# Patient Record
Sex: Female | Born: 1945
Health system: Southern US, Community
[De-identification: ages and names within clinical notes are randomized; demographics above are authoritative.]

---

## 2014-06-19 DIAGNOSIS — R6889 Other general symptoms and signs: Secondary | ICD-10-CM | POA: Diagnosis not present

## 2014-06-25 DIAGNOSIS — Z23 Encounter for immunization: Secondary | ICD-10-CM | POA: Diagnosis not present

## 2014-06-25 DIAGNOSIS — R0789 Other chest pain: Secondary | ICD-10-CM | POA: Diagnosis not present

## 2014-06-30 DIAGNOSIS — L720 Epidermal cyst: Secondary | ICD-10-CM | POA: Diagnosis not present

## 2014-06-30 DIAGNOSIS — L900 Lichen sclerosus et atrophicus: Secondary | ICD-10-CM | POA: Diagnosis not present

## 2014-06-30 DIAGNOSIS — Z09 Encounter for follow-up examination after completed treatment for conditions other than malignant neoplasm: Secondary | ICD-10-CM | POA: Diagnosis not present

## 2014-06-30 DIAGNOSIS — L239 Allergic contact dermatitis, unspecified cause: Secondary | ICD-10-CM | POA: Diagnosis not present

## 2014-06-30 DIAGNOSIS — Q828 Other specified congenital malformations of skin: Secondary | ICD-10-CM | POA: Diagnosis not present

## 2014-06-30 DIAGNOSIS — Z808 Family history of malignant neoplasm of other organs or systems: Secondary | ICD-10-CM | POA: Diagnosis not present

## 2014-06-30 DIAGNOSIS — D2272 Melanocytic nevi of left lower limb, including hip: Secondary | ICD-10-CM | POA: Diagnosis not present

## 2014-06-30 DIAGNOSIS — L820 Inflamed seborrheic keratosis: Secondary | ICD-10-CM | POA: Diagnosis not present

## 2014-06-30 DIAGNOSIS — L309 Dermatitis, unspecified: Secondary | ICD-10-CM | POA: Diagnosis not present

## 2014-06-30 DIAGNOSIS — L821 Other seborrheic keratosis: Secondary | ICD-10-CM | POA: Diagnosis not present

## 2014-07-04 DIAGNOSIS — R079 Chest pain, unspecified: Secondary | ICD-10-CM | POA: Diagnosis not present

## 2014-07-04 DIAGNOSIS — I348 Other nonrheumatic mitral valve disorders: Secondary | ICD-10-CM | POA: Diagnosis not present

## 2014-07-07 DIAGNOSIS — L900 Lichen sclerosus et atrophicus: Secondary | ICD-10-CM | POA: Diagnosis not present

## 2014-08-07 DIAGNOSIS — E782 Mixed hyperlipidemia: Secondary | ICD-10-CM | POA: Diagnosis not present

## 2014-08-07 DIAGNOSIS — M8589 Other specified disorders of bone density and structure, multiple sites: Secondary | ICD-10-CM | POA: Diagnosis not present

## 2014-08-07 DIAGNOSIS — F411 Generalized anxiety disorder: Secondary | ICD-10-CM | POA: Diagnosis not present

## 2014-08-07 DIAGNOSIS — Z0001 Encounter for general adult medical examination with abnormal findings: Secondary | ICD-10-CM | POA: Diagnosis not present

## 2014-08-07 DIAGNOSIS — Z79899 Other long term (current) drug therapy: Secondary | ICD-10-CM | POA: Diagnosis not present

## 2014-08-07 DIAGNOSIS — G4700 Insomnia, unspecified: Secondary | ICD-10-CM | POA: Diagnosis not present

## 2014-08-19 DIAGNOSIS — R29890 Loss of height: Secondary | ICD-10-CM | POA: Diagnosis not present

## 2014-08-19 DIAGNOSIS — M419 Scoliosis, unspecified: Secondary | ICD-10-CM | POA: Diagnosis not present

## 2014-08-19 DIAGNOSIS — M8589 Other specified disorders of bone density and structure, multiple sites: Secondary | ICD-10-CM | POA: Diagnosis not present

## 2014-08-19 DIAGNOSIS — E28319 Asymptomatic premature menopause: Secondary | ICD-10-CM | POA: Diagnosis not present

## 2014-08-19 DIAGNOSIS — M549 Dorsalgia, unspecified: Secondary | ICD-10-CM | POA: Diagnosis not present

## 2014-09-29 DIAGNOSIS — L259 Unspecified contact dermatitis, unspecified cause: Secondary | ICD-10-CM | POA: Diagnosis not present

## 2014-09-29 DIAGNOSIS — L900 Lichen sclerosus et atrophicus: Secondary | ICD-10-CM | POA: Diagnosis not present

## 2014-11-06 DIAGNOSIS — L900 Lichen sclerosus et atrophicus: Secondary | ICD-10-CM | POA: Diagnosis not present

## 2014-11-28 DIAGNOSIS — L989 Disorder of the skin and subcutaneous tissue, unspecified: Secondary | ICD-10-CM | POA: Diagnosis not present

## 2014-11-28 DIAGNOSIS — L900 Lichen sclerosus et atrophicus: Secondary | ICD-10-CM | POA: Diagnosis not present

## 2014-12-01 DIAGNOSIS — D485 Neoplasm of uncertain behavior of skin: Secondary | ICD-10-CM | POA: Diagnosis not present

## 2014-12-01 DIAGNOSIS — L570 Actinic keratosis: Secondary | ICD-10-CM | POA: Diagnosis not present

## 2014-12-01 DIAGNOSIS — L280 Lichen simplex chronicus: Secondary | ICD-10-CM | POA: Diagnosis not present

## 2015-03-06 DIAGNOSIS — E559 Vitamin D deficiency, unspecified: Secondary | ICD-10-CM | POA: Diagnosis not present

## 2015-03-06 DIAGNOSIS — Z79899 Other long term (current) drug therapy: Secondary | ICD-10-CM | POA: Diagnosis not present

## 2015-03-06 DIAGNOSIS — R0602 Shortness of breath: Secondary | ICD-10-CM | POA: Diagnosis not present

## 2015-03-06 DIAGNOSIS — Z23 Encounter for immunization: Secondary | ICD-10-CM | POA: Diagnosis not present

## 2015-03-06 DIAGNOSIS — R5383 Other fatigue: Secondary | ICD-10-CM | POA: Diagnosis not present

## 2015-03-06 DIAGNOSIS — E7800 Pure hypercholesterolemia, unspecified: Secondary | ICD-10-CM | POA: Diagnosis not present

## 2015-03-06 DIAGNOSIS — Z0000 Encounter for general adult medical examination without abnormal findings: Secondary | ICD-10-CM | POA: Diagnosis not present

## 2015-03-06 DIAGNOSIS — Z1389 Encounter for screening for other disorder: Secondary | ICD-10-CM | POA: Diagnosis not present

## 2015-03-16 DIAGNOSIS — E7800 Pure hypercholesterolemia, unspecified: Secondary | ICD-10-CM | POA: Diagnosis not present

## 2015-03-16 DIAGNOSIS — J309 Allergic rhinitis, unspecified: Secondary | ICD-10-CM | POA: Diagnosis not present

## 2015-03-16 DIAGNOSIS — E559 Vitamin D deficiency, unspecified: Secondary | ICD-10-CM | POA: Diagnosis not present

## 2015-03-16 DIAGNOSIS — H1013 Acute atopic conjunctivitis, bilateral: Secondary | ICD-10-CM | POA: Diagnosis not present

## 2015-04-02 DIAGNOSIS — L900 Lichen sclerosus et atrophicus: Secondary | ICD-10-CM | POA: Diagnosis not present

## 2015-05-01 DIAGNOSIS — B353 Tinea pedis: Secondary | ICD-10-CM | POA: Diagnosis not present

## 2015-05-01 DIAGNOSIS — L900 Lichen sclerosus et atrophicus: Secondary | ICD-10-CM | POA: Diagnosis not present

## 2015-06-23 DIAGNOSIS — R05 Cough: Secondary | ICD-10-CM | POA: Diagnosis not present

## 2015-06-23 DIAGNOSIS — J018 Other acute sinusitis: Secondary | ICD-10-CM | POA: Diagnosis not present

## 2015-07-17 DIAGNOSIS — N904 Leukoplakia of vulva: Secondary | ICD-10-CM | POA: Diagnosis not present

## 2015-07-17 DIAGNOSIS — Z79899 Other long term (current) drug therapy: Secondary | ICD-10-CM | POA: Diagnosis not present

## 2015-10-29 DIAGNOSIS — I10 Essential (primary) hypertension: Secondary | ICD-10-CM | POA: Diagnosis not present

## 2015-10-29 DIAGNOSIS — G4700 Insomnia, unspecified: Secondary | ICD-10-CM | POA: Diagnosis not present

## 2015-10-29 DIAGNOSIS — M810 Age-related osteoporosis without current pathological fracture: Secondary | ICD-10-CM | POA: Diagnosis not present

## 2015-10-29 DIAGNOSIS — E7800 Pure hypercholesterolemia, unspecified: Secondary | ICD-10-CM | POA: Diagnosis not present

## 2015-10-29 DIAGNOSIS — Z79899 Other long term (current) drug therapy: Secondary | ICD-10-CM | POA: Diagnosis not present

## 2015-10-29 DIAGNOSIS — F329 Major depressive disorder, single episode, unspecified: Secondary | ICD-10-CM | POA: Diagnosis not present

## 2015-10-30 DIAGNOSIS — E559 Vitamin D deficiency, unspecified: Secondary | ICD-10-CM | POA: Diagnosis not present

## 2015-10-30 DIAGNOSIS — Z79899 Other long term (current) drug therapy: Secondary | ICD-10-CM | POA: Diagnosis not present

## 2015-10-30 DIAGNOSIS — M810 Age-related osteoporosis without current pathological fracture: Secondary | ICD-10-CM | POA: Diagnosis not present

## 2015-10-30 DIAGNOSIS — E7800 Pure hypercholesterolemia, unspecified: Secondary | ICD-10-CM | POA: Diagnosis not present

## 2015-11-17 ENCOUNTER — Other Ambulatory Visit: Payer: Self-pay | Admitting: Family Medicine

## 2015-11-17 DIAGNOSIS — Z1231 Encounter for screening mammogram for malignant neoplasm of breast: Secondary | ICD-10-CM

## 2015-11-25 ENCOUNTER — Ambulatory Visit
Admission: RE | Admit: 2015-11-25 | Discharge: 2015-11-25 | Disposition: A | Discharge status: Home / Self Care | Payer: Medicare Other | Source: Ambulatory Visit | Attending: Family Medicine | Admitting: Family Medicine

## 2015-11-25 DIAGNOSIS — Z1231 Encounter for screening mammogram for malignant neoplasm of breast: Secondary | ICD-10-CM | POA: Diagnosis not present

## 2016-01-15 DIAGNOSIS — L309 Dermatitis, unspecified: Secondary | ICD-10-CM | POA: Diagnosis not present

## 2016-01-15 DIAGNOSIS — L851 Acquired keratosis [keratoderma] palmaris et plantaris: Secondary | ICD-10-CM | POA: Diagnosis not present

## 2016-01-15 DIAGNOSIS — L900 Lichen sclerosus et atrophicus: Secondary | ICD-10-CM | POA: Diagnosis not present

## 2016-03-11 DIAGNOSIS — Z23 Encounter for immunization: Secondary | ICD-10-CM | POA: Diagnosis not present

## 2016-04-25 DIAGNOSIS — J069 Acute upper respiratory infection, unspecified: Secondary | ICD-10-CM | POA: Diagnosis not present

## 2016-06-23 DIAGNOSIS — Z0000 Encounter for general adult medical examination without abnormal findings: Secondary | ICD-10-CM | POA: Diagnosis not present

## 2016-06-23 DIAGNOSIS — E559 Vitamin D deficiency, unspecified: Secondary | ICD-10-CM | POA: Diagnosis not present

## 2016-06-23 DIAGNOSIS — R7301 Impaired fasting glucose: Secondary | ICD-10-CM | POA: Diagnosis not present

## 2016-06-23 DIAGNOSIS — E7800 Pure hypercholesterolemia, unspecified: Secondary | ICD-10-CM | POA: Diagnosis not present

## 2016-06-23 DIAGNOSIS — I10 Essential (primary) hypertension: Secondary | ICD-10-CM | POA: Diagnosis not present

## 2016-06-23 DIAGNOSIS — G4700 Insomnia, unspecified: Secondary | ICD-10-CM | POA: Diagnosis not present

## 2016-06-23 DIAGNOSIS — Z6829 Body mass index (BMI) 29.0-29.9, adult: Secondary | ICD-10-CM | POA: Diagnosis not present

## 2016-06-23 DIAGNOSIS — Z79899 Other long term (current) drug therapy: Secondary | ICD-10-CM | POA: Diagnosis not present

## 2016-06-23 DIAGNOSIS — Z1159 Encounter for screening for other viral diseases: Secondary | ICD-10-CM | POA: Diagnosis not present

## 2016-07-12 DIAGNOSIS — Z1211 Encounter for screening for malignant neoplasm of colon: Secondary | ICD-10-CM | POA: Diagnosis not present

## 2016-07-15 DIAGNOSIS — L851 Acquired keratosis [keratoderma] palmaris et plantaris: Secondary | ICD-10-CM | POA: Diagnosis not present

## 2016-07-15 DIAGNOSIS — L821 Other seborrheic keratosis: Secondary | ICD-10-CM | POA: Diagnosis not present

## 2016-07-15 DIAGNOSIS — L720 Epidermal cyst: Secondary | ICD-10-CM | POA: Diagnosis not present

## 2016-07-15 DIAGNOSIS — L900 Lichen sclerosus et atrophicus: Secondary | ICD-10-CM | POA: Diagnosis not present

## 2016-07-15 DIAGNOSIS — L918 Other hypertrophic disorders of the skin: Secondary | ICD-10-CM | POA: Diagnosis not present

## 2016-07-15 DIAGNOSIS — Q828 Other specified congenital malformations of skin: Secondary | ICD-10-CM | POA: Diagnosis not present

## 2016-07-15 DIAGNOSIS — L820 Inflamed seborrheic keratosis: Secondary | ICD-10-CM | POA: Diagnosis not present

## 2016-08-19 DIAGNOSIS — L720 Epidermal cyst: Secondary | ICD-10-CM | POA: Diagnosis not present

## 2016-08-19 DIAGNOSIS — L989 Disorder of the skin and subcutaneous tissue, unspecified: Secondary | ICD-10-CM | POA: Diagnosis not present

## 2016-08-19 DIAGNOSIS — D485 Neoplasm of uncertain behavior of skin: Secondary | ICD-10-CM | POA: Diagnosis not present

## 2016-08-19 DIAGNOSIS — Z888 Allergy status to other drugs, medicaments and biological substances status: Secondary | ICD-10-CM | POA: Diagnosis not present

## 2016-08-19 DIAGNOSIS — C44519 Basal cell carcinoma of skin of other part of trunk: Secondary | ICD-10-CM | POA: Diagnosis not present

## 2016-10-07 DIAGNOSIS — C4491 Basal cell carcinoma of skin, unspecified: Secondary | ICD-10-CM | POA: Diagnosis not present

## 2016-10-07 DIAGNOSIS — C44519 Basal cell carcinoma of skin of other part of trunk: Secondary | ICD-10-CM | POA: Diagnosis not present

## 2016-11-14 ENCOUNTER — Other Ambulatory Visit: Payer: Self-pay | Admitting: Family Medicine

## 2016-11-14 DIAGNOSIS — Z1231 Encounter for screening mammogram for malignant neoplasm of breast: Secondary | ICD-10-CM

## 2016-11-24 DIAGNOSIS — R2230 Localized swelling, mass and lump, unspecified upper limb: Secondary | ICD-10-CM | POA: Diagnosis not present

## 2016-11-24 DIAGNOSIS — Z800 Family history of malignant neoplasm of digestive organs: Secondary | ICD-10-CM | POA: Diagnosis not present

## 2017-01-02 ENCOUNTER — Other Ambulatory Visit: Payer: Self-pay | Admitting: Family Medicine

## 2017-01-02 DIAGNOSIS — R2232 Localized swelling, mass and lump, left upper limb: Secondary | ICD-10-CM

## 2017-01-04 DIAGNOSIS — Z800 Family history of malignant neoplasm of digestive organs: Secondary | ICD-10-CM | POA: Diagnosis not present

## 2017-01-04 DIAGNOSIS — K6289 Other specified diseases of anus and rectum: Secondary | ICD-10-CM | POA: Diagnosis not present

## 2017-01-04 DIAGNOSIS — D126 Benign neoplasm of colon, unspecified: Secondary | ICD-10-CM | POA: Diagnosis not present

## 2017-01-04 DIAGNOSIS — Z1211 Encounter for screening for malignant neoplasm of colon: Secondary | ICD-10-CM | POA: Diagnosis not present

## 2017-01-04 DIAGNOSIS — D175 Benign lipomatous neoplasm of intra-abdominal organs: Secondary | ICD-10-CM | POA: Diagnosis not present

## 2017-01-05 DIAGNOSIS — D126 Benign neoplasm of colon, unspecified: Secondary | ICD-10-CM | POA: Diagnosis not present

## 2017-01-05 DIAGNOSIS — Z1211 Encounter for screening for malignant neoplasm of colon: Secondary | ICD-10-CM | POA: Diagnosis not present

## 2017-01-30 ENCOUNTER — Other Ambulatory Visit: Payer: Medicare Other

## 2017-02-03 ENCOUNTER — Other Ambulatory Visit: Payer: Medicare Other

## 2017-02-07 ENCOUNTER — Ambulatory Visit
Admission: RE | Admit: 2017-02-07 | Discharge: 2017-02-07 | Disposition: A | Discharge status: Home / Self Care | Payer: Medicare Other | Source: Ambulatory Visit | Attending: Family Medicine | Admitting: Family Medicine

## 2017-02-07 DIAGNOSIS — R928 Other abnormal and inconclusive findings on diagnostic imaging of breast: Secondary | ICD-10-CM | POA: Diagnosis not present

## 2017-02-07 DIAGNOSIS — R2232 Localized swelling, mass and lump, left upper limb: Secondary | ICD-10-CM

## 2017-02-07 DIAGNOSIS — N6489 Other specified disorders of breast: Secondary | ICD-10-CM | POA: Diagnosis not present

## 2017-02-08 DIAGNOSIS — M8588 Other specified disorders of bone density and structure, other site: Secondary | ICD-10-CM | POA: Diagnosis not present

## 2017-02-08 DIAGNOSIS — E2839 Other primary ovarian failure: Secondary | ICD-10-CM | POA: Diagnosis not present

## 2017-02-10 DIAGNOSIS — L900 Lichen sclerosus et atrophicus: Secondary | ICD-10-CM | POA: Diagnosis not present

## 2017-03-09 DIAGNOSIS — Z23 Encounter for immunization: Secondary | ICD-10-CM | POA: Diagnosis not present

## 2017-03-21 DIAGNOSIS — J0100 Acute maxillary sinusitis, unspecified: Secondary | ICD-10-CM | POA: Diagnosis not present

## 2017-03-21 DIAGNOSIS — J069 Acute upper respiratory infection, unspecified: Secondary | ICD-10-CM | POA: Diagnosis not present

## 2017-03-28 ENCOUNTER — Other Ambulatory Visit: Payer: Self-pay | Admitting: Family Medicine

## 2017-03-28 ENCOUNTER — Ambulatory Visit
Admission: RE | Admit: 2017-03-28 | Discharge: 2017-03-28 | Disposition: A | Discharge status: Home / Self Care | Payer: Medicare Other | Source: Ambulatory Visit | Attending: Family Medicine | Admitting: Family Medicine

## 2017-03-28 DIAGNOSIS — R059 Cough, unspecified: Secondary | ICD-10-CM

## 2017-03-28 DIAGNOSIS — R062 Wheezing: Secondary | ICD-10-CM

## 2017-03-28 DIAGNOSIS — R05 Cough: Secondary | ICD-10-CM

## 2017-03-28 DIAGNOSIS — J9801 Acute bronchospasm: Secondary | ICD-10-CM | POA: Diagnosis not present

## 2017-07-04 DIAGNOSIS — E559 Vitamin D deficiency, unspecified: Secondary | ICD-10-CM | POA: Diagnosis not present

## 2017-07-04 DIAGNOSIS — E7800 Pure hypercholesterolemia, unspecified: Secondary | ICD-10-CM | POA: Diagnosis not present

## 2017-07-04 DIAGNOSIS — R7301 Impaired fasting glucose: Secondary | ICD-10-CM | POA: Diagnosis not present

## 2017-07-04 DIAGNOSIS — Z79899 Other long term (current) drug therapy: Secondary | ICD-10-CM | POA: Diagnosis not present

## 2017-07-11 DIAGNOSIS — E559 Vitamin D deficiency, unspecified: Secondary | ICD-10-CM | POA: Diagnosis not present

## 2017-07-11 DIAGNOSIS — F322 Major depressive disorder, single episode, severe without psychotic features: Secondary | ICD-10-CM | POA: Diagnosis not present

## 2017-07-11 DIAGNOSIS — G4700 Insomnia, unspecified: Secondary | ICD-10-CM | POA: Diagnosis not present

## 2017-07-11 DIAGNOSIS — R7301 Impaired fasting glucose: Secondary | ICD-10-CM | POA: Diagnosis not present

## 2017-07-11 DIAGNOSIS — Z0000 Encounter for general adult medical examination without abnormal findings: Secondary | ICD-10-CM | POA: Diagnosis not present

## 2017-07-11 DIAGNOSIS — Z6829 Body mass index (BMI) 29.0-29.9, adult: Secondary | ICD-10-CM | POA: Diagnosis not present

## 2017-07-11 DIAGNOSIS — E7800 Pure hypercholesterolemia, unspecified: Secondary | ICD-10-CM | POA: Diagnosis not present

## 2017-08-11 DIAGNOSIS — L900 Lichen sclerosus et atrophicus: Secondary | ICD-10-CM | POA: Diagnosis not present

## 2017-08-11 DIAGNOSIS — L309 Dermatitis, unspecified: Secondary | ICD-10-CM | POA: Diagnosis not present

## 2017-08-11 DIAGNOSIS — L249 Irritant contact dermatitis, unspecified cause: Secondary | ICD-10-CM | POA: Diagnosis not present

## 2017-08-11 DIAGNOSIS — Z888 Allergy status to other drugs, medicaments and biological substances status: Secondary | ICD-10-CM | POA: Diagnosis not present

## 2017-08-17 DIAGNOSIS — L304 Erythema intertrigo: Secondary | ICD-10-CM | POA: Diagnosis not present

## 2017-08-18 DIAGNOSIS — L304 Erythema intertrigo: Secondary | ICD-10-CM | POA: Diagnosis not present

## 2017-08-24 DIAGNOSIS — K6289 Other specified diseases of anus and rectum: Secondary | ICD-10-CM | POA: Diagnosis not present

## 2017-08-24 DIAGNOSIS — D126 Benign neoplasm of colon, unspecified: Secondary | ICD-10-CM | POA: Diagnosis not present

## 2017-08-24 DIAGNOSIS — Z86010 Personal history of colonic polyps: Secondary | ICD-10-CM | POA: Diagnosis not present

## 2017-08-24 DIAGNOSIS — K635 Polyp of colon: Secondary | ICD-10-CM | POA: Diagnosis not present

## 2017-08-29 DIAGNOSIS — D126 Benign neoplasm of colon, unspecified: Secondary | ICD-10-CM | POA: Diagnosis not present

## 2017-08-29 DIAGNOSIS — K635 Polyp of colon: Secondary | ICD-10-CM | POA: Diagnosis not present

## 2017-10-09 DIAGNOSIS — Z85828 Personal history of other malignant neoplasm of skin: Secondary | ICD-10-CM | POA: Diagnosis not present

## 2017-10-09 DIAGNOSIS — L720 Epidermal cyst: Secondary | ICD-10-CM | POA: Diagnosis not present

## 2017-10-09 DIAGNOSIS — L918 Other hypertrophic disorders of the skin: Secondary | ICD-10-CM | POA: Diagnosis not present

## 2017-10-09 DIAGNOSIS — D1801 Hemangioma of skin and subcutaneous tissue: Secondary | ICD-10-CM | POA: Diagnosis not present

## 2017-10-09 DIAGNOSIS — D229 Melanocytic nevi, unspecified: Secondary | ICD-10-CM | POA: Diagnosis not present

## 2017-10-09 DIAGNOSIS — L821 Other seborrheic keratosis: Secondary | ICD-10-CM | POA: Diagnosis not present

## 2017-10-09 DIAGNOSIS — I781 Nevus, non-neoplastic: Secondary | ICD-10-CM | POA: Diagnosis not present

## 2017-10-21 IMAGING — MG DIGITAL SCREENING BILATERAL MAMMOGRAM WITH CAD
4 series · 4 of 4 positions shown · non-contrast
Comparison: Previous exam(s).

CLINICAL DATA: Screening.

EXAM:
DIGITAL SCREENING BILATERAL MAMMOGRAM WITH CAD

[L MLO]
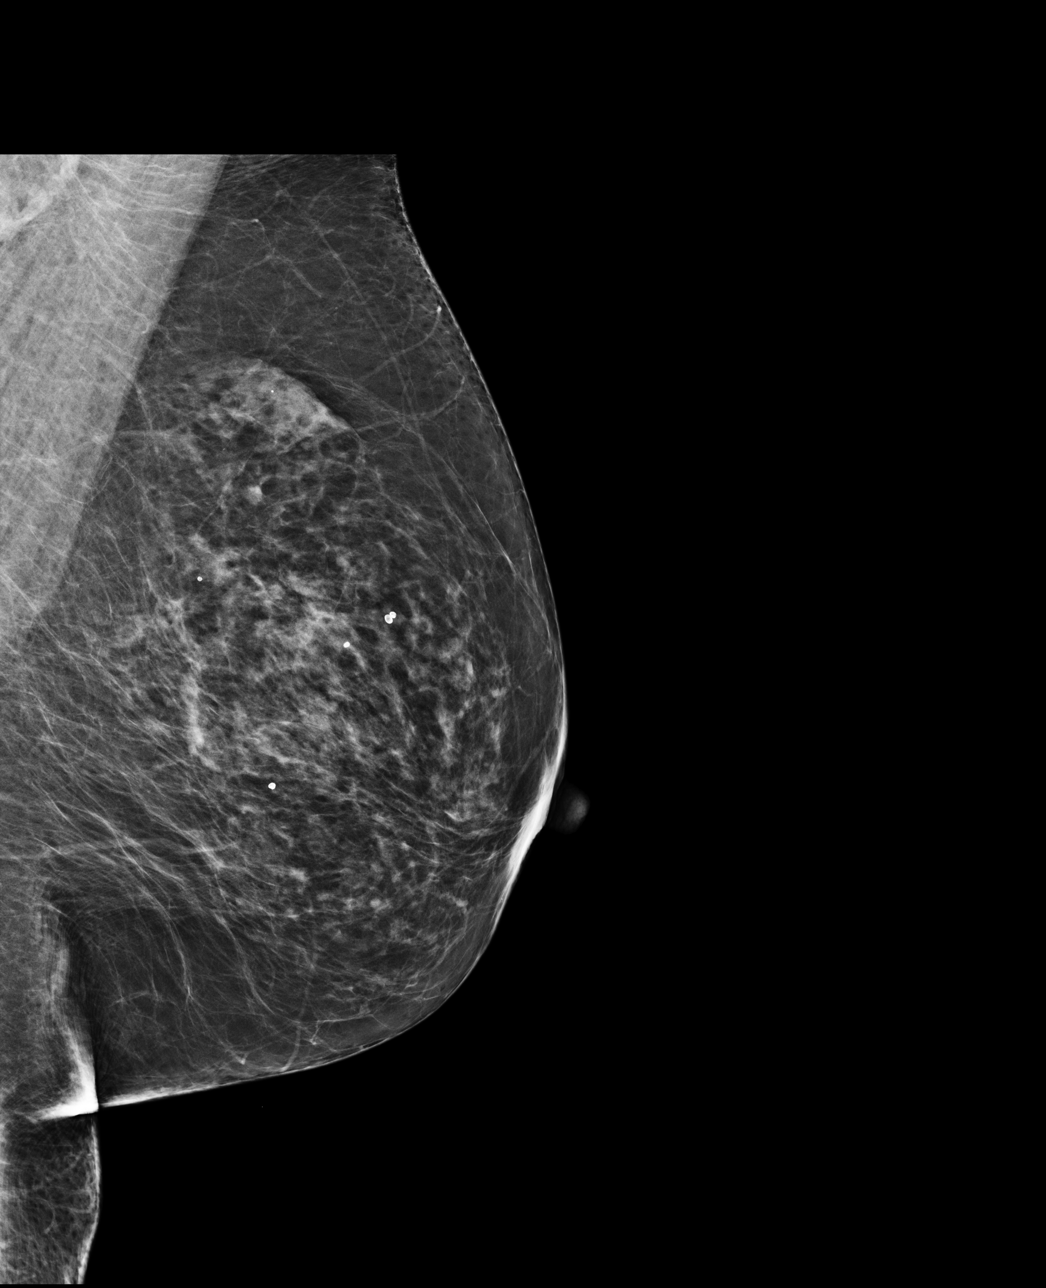

[L CC]
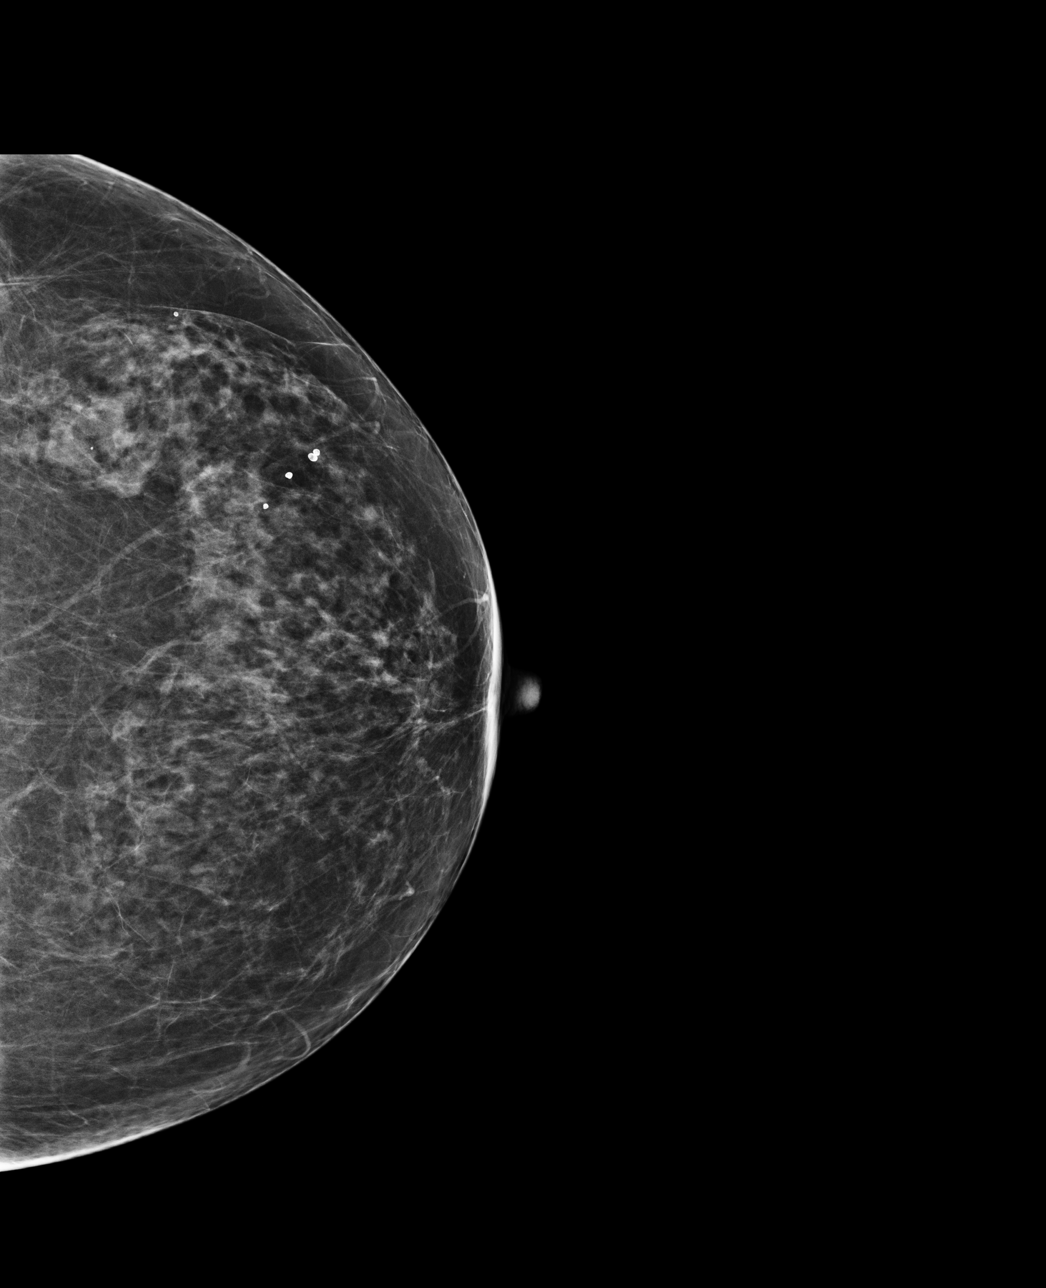

[R CC]
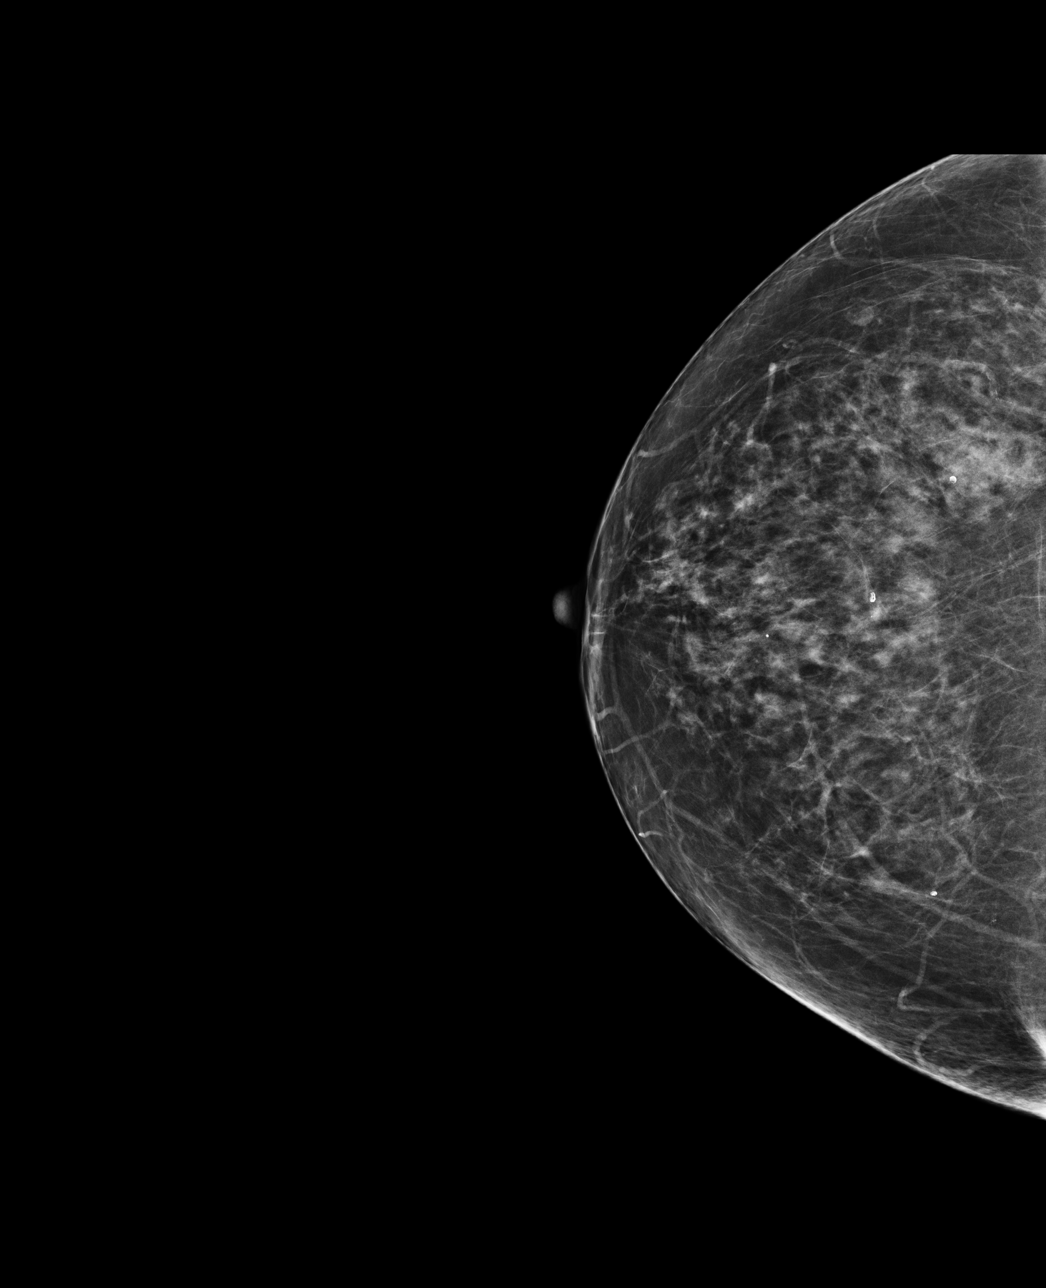

[R MLO]
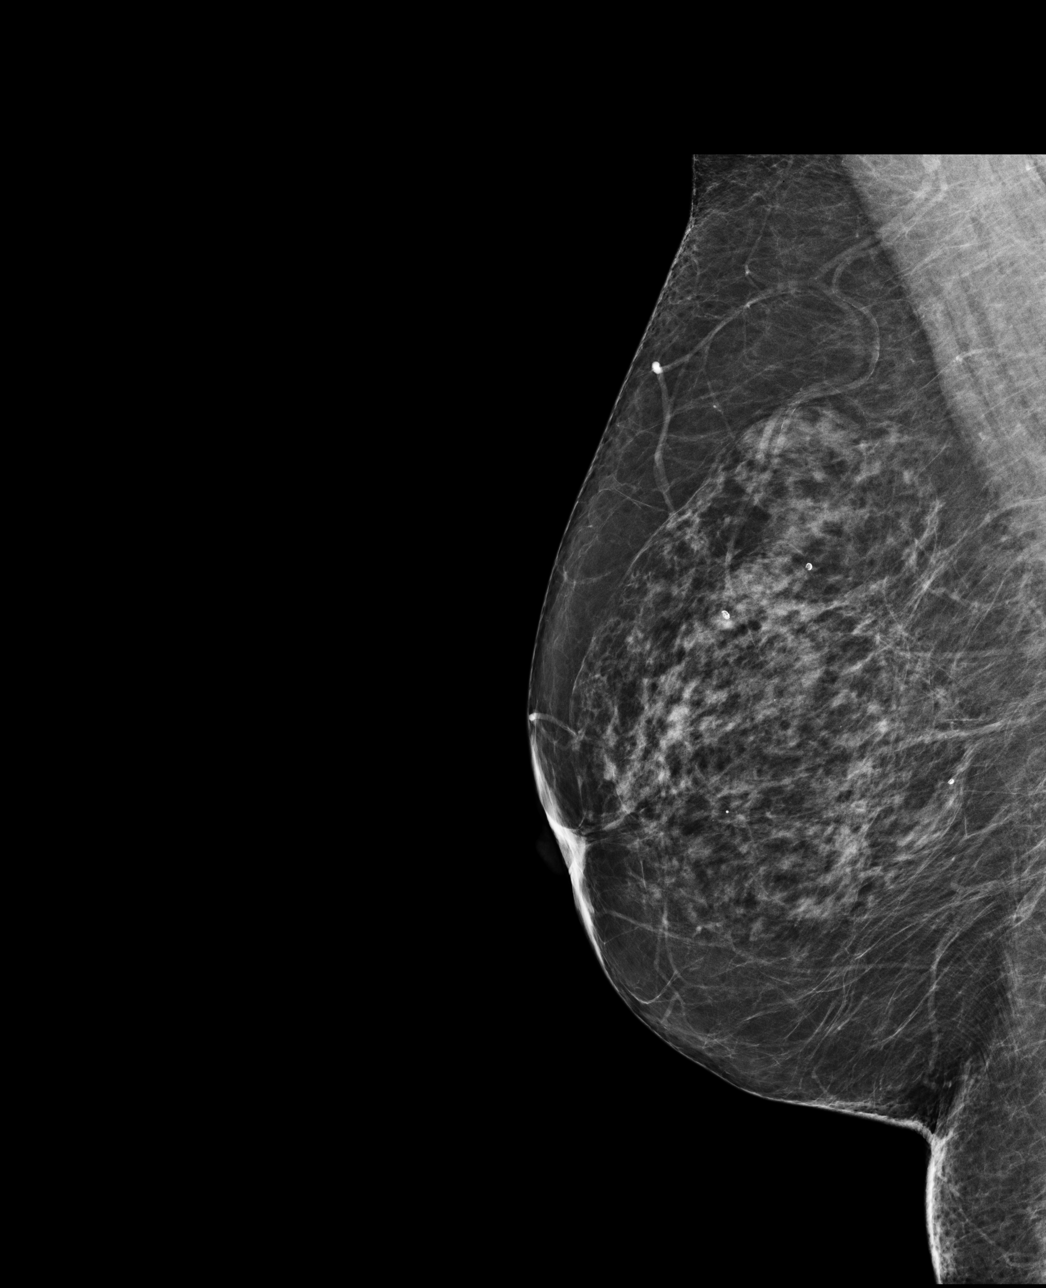

[4 of 4 positions shown; findings below may reference images not displayed]

ACR Breast Density Category c: The breast tissue is heterogeneously
dense, which may obscure small masses.
FINDINGS: There are no findings suspicious for malignancy. Images were
processed with CAD.
IMPRESSION: No mammographic evidence of malignancy. A result letter of this
screening mammogram will be mailed directly to the patient.

RECOMMENDATION:
Screening mammogram in one year. (Code:[id number])

BI-RADS CATEGORY  1: Negative.

## 2017-10-25 DIAGNOSIS — R05 Cough: Secondary | ICD-10-CM | POA: Diagnosis not present

## 2017-11-17 DIAGNOSIS — L900 Lichen sclerosus et atrophicus: Secondary | ICD-10-CM | POA: Diagnosis not present

## 2017-11-17 DIAGNOSIS — L304 Erythema intertrigo: Secondary | ICD-10-CM | POA: Diagnosis not present

## 2018-03-05 DIAGNOSIS — Z23 Encounter for immunization: Secondary | ICD-10-CM | POA: Diagnosis not present

## 2018-03-06 DIAGNOSIS — R05 Cough: Secondary | ICD-10-CM | POA: Diagnosis not present

## 2018-03-15 ENCOUNTER — Other Ambulatory Visit: Payer: Self-pay | Admitting: Family Medicine

## 2018-03-15 DIAGNOSIS — Z1231 Encounter for screening mammogram for malignant neoplasm of breast: Secondary | ICD-10-CM

## 2018-03-20 DIAGNOSIS — B370 Candidal stomatitis: Secondary | ICD-10-CM | POA: Diagnosis not present

## 2018-03-27 ENCOUNTER — Ambulatory Visit
Admission: RE | Admit: 2018-03-27 | Discharge: 2018-03-27 | Disposition: A | Discharge status: Home / Self Care | Payer: Medicare Other | Source: Ambulatory Visit | Attending: Family Medicine | Admitting: Family Medicine

## 2018-03-27 DIAGNOSIS — Z1231 Encounter for screening mammogram for malignant neoplasm of breast: Secondary | ICD-10-CM

## 2018-06-01 DIAGNOSIS — N904 Leukoplakia of vulva: Secondary | ICD-10-CM | POA: Diagnosis not present

## 2018-06-01 DIAGNOSIS — R432 Parageusia: Secondary | ICD-10-CM | POA: Diagnosis not present

## 2018-06-01 DIAGNOSIS — L304 Erythema intertrigo: Secondary | ICD-10-CM | POA: Diagnosis not present

## 2018-06-01 DIAGNOSIS — L900 Lichen sclerosus et atrophicus: Secondary | ICD-10-CM | POA: Diagnosis not present

## 2018-06-01 DIAGNOSIS — Z7952 Long term (current) use of systemic steroids: Secondary | ICD-10-CM | POA: Diagnosis not present

## 2018-08-10 DIAGNOSIS — E7800 Pure hypercholesterolemia, unspecified: Secondary | ICD-10-CM | POA: Diagnosis not present

## 2018-08-10 DIAGNOSIS — R7301 Impaired fasting glucose: Secondary | ICD-10-CM | POA: Diagnosis not present

## 2018-08-10 DIAGNOSIS — E559 Vitamin D deficiency, unspecified: Secondary | ICD-10-CM | POA: Diagnosis not present

## 2018-08-10 DIAGNOSIS — Z79899 Other long term (current) drug therapy: Secondary | ICD-10-CM | POA: Diagnosis not present

## 2018-08-14 DIAGNOSIS — Z800 Family history of malignant neoplasm of digestive organs: Secondary | ICD-10-CM | POA: Diagnosis not present

## 2018-08-14 DIAGNOSIS — I10 Essential (primary) hypertension: Secondary | ICD-10-CM | POA: Diagnosis not present

## 2018-08-14 DIAGNOSIS — F329 Major depressive disorder, single episode, unspecified: Secondary | ICD-10-CM | POA: Diagnosis not present

## 2018-08-14 DIAGNOSIS — M47814 Spondylosis without myelopathy or radiculopathy, thoracic region: Secondary | ICD-10-CM | POA: Diagnosis not present

## 2018-08-14 DIAGNOSIS — I771 Stricture of artery: Secondary | ICD-10-CM | POA: Diagnosis not present

## 2018-08-14 DIAGNOSIS — H00019 Hordeolum externum unspecified eye, unspecified eyelid: Secondary | ICD-10-CM | POA: Diagnosis not present

## 2018-08-14 DIAGNOSIS — R7301 Impaired fasting glucose: Secondary | ICD-10-CM | POA: Diagnosis not present

## 2018-08-14 DIAGNOSIS — E559 Vitamin D deficiency, unspecified: Secondary | ICD-10-CM | POA: Diagnosis not present

## 2018-08-14 DIAGNOSIS — Z0000 Encounter for general adult medical examination without abnormal findings: Secondary | ICD-10-CM | POA: Diagnosis not present

## 2018-08-14 DIAGNOSIS — M810 Age-related osteoporosis without current pathological fracture: Secondary | ICD-10-CM | POA: Diagnosis not present

## 2018-08-14 DIAGNOSIS — G4700 Insomnia, unspecified: Secondary | ICD-10-CM | POA: Diagnosis not present

## 2018-08-14 DIAGNOSIS — E7800 Pure hypercholesterolemia, unspecified: Secondary | ICD-10-CM | POA: Diagnosis not present

## 2018-11-02 DIAGNOSIS — Z85828 Personal history of other malignant neoplasm of skin: Secondary | ICD-10-CM | POA: Diagnosis not present

## 2018-11-02 DIAGNOSIS — D229 Melanocytic nevi, unspecified: Secondary | ICD-10-CM | POA: Diagnosis not present

## 2018-11-02 DIAGNOSIS — L821 Other seborrheic keratosis: Secondary | ICD-10-CM | POA: Diagnosis not present

## 2018-11-02 DIAGNOSIS — L281 Prurigo nodularis: Secondary | ICD-10-CM | POA: Diagnosis not present

## 2018-11-02 DIAGNOSIS — D1801 Hemangioma of skin and subcutaneous tissue: Secondary | ICD-10-CM | POA: Diagnosis not present

## 2018-11-02 DIAGNOSIS — L814 Other melanin hyperpigmentation: Secondary | ICD-10-CM | POA: Diagnosis not present

## 2018-11-30 DIAGNOSIS — L900 Lichen sclerosus et atrophicus: Secondary | ICD-10-CM | POA: Diagnosis not present

## 2019-02-26 DIAGNOSIS — Z23 Encounter for immunization: Secondary | ICD-10-CM | POA: Diagnosis not present

## 2019-04-15 ENCOUNTER — Other Ambulatory Visit: Payer: Self-pay | Admitting: Family Medicine

## 2019-04-15 DIAGNOSIS — Z1231 Encounter for screening mammogram for malignant neoplasm of breast: Secondary | ICD-10-CM

## 2019-05-29 DIAGNOSIS — B356 Tinea cruris: Secondary | ICD-10-CM | POA: Diagnosis not present

## 2019-06-05 ENCOUNTER — Other Ambulatory Visit: Payer: Self-pay

## 2019-06-05 ENCOUNTER — Ambulatory Visit
Admission: RE | Admit: 2019-06-05 | Discharge: 2019-06-05 | Disposition: A | Discharge status: Home / Self Care | Payer: Medicare Other | Source: Ambulatory Visit | Attending: Family Medicine | Admitting: Family Medicine

## 2019-06-05 DIAGNOSIS — Z1231 Encounter for screening mammogram for malignant neoplasm of breast: Secondary | ICD-10-CM

## 2019-07-06 ENCOUNTER — Ambulatory Visit: Payer: Medicare Other | Attending: Internal Medicine

## 2019-07-06 DIAGNOSIS — Z23 Encounter for immunization: Secondary | ICD-10-CM

## 2019-07-06 NOTE — Progress Notes (Signed)
   Covid-19 Vaccination Clinic  Name:  Janet Cain    MRN: [id number] DOB: 1945-07-20  07/06/2019  [name] was observed post Covid-19 immunization for 15 minutes without incidence. She was provided with Vaccine Information Sheet and instruction to access the V-Safe system.   [name] was instructed to call 911 with any severe reactions post vaccine: Marland Kitchen Difficulty breathing  . Swelling of your face and throat  . A fast heartbeat  . A bad rash all over your body  . Dizziness and weakness    Immunizations Administered    Name Date Dose VIS Date Route   Pfizer COVID-19 Vaccine 07/06/2019  2:33 PM 0.3 mL 05/03/2019 Intramuscular   Manufacturer: [place]   Lot: [id number]   [place]: [id number]

## 2019-07-29 ENCOUNTER — Ambulatory Visit: Payer: Medicare Other | Attending: Internal Medicine

## 2019-07-29 DIAGNOSIS — Z23 Encounter for immunization: Secondary | ICD-10-CM | POA: Insufficient documentation

## 2019-07-29 NOTE — Progress Notes (Signed)
   Covid-19 Vaccination Clinic  Name:  Janet Cain    MRN: [id number] DOB: 06-12-1945  07/29/2019  [name] was observed post Covid-19 immunization for 15 minutes without incident. She was provided with Vaccine Information Sheet and instruction to access the V-Safe system.   [name] was instructed to call 911 with any severe reactions post vaccine: Marland Kitchen Difficulty breathing  . Swelling of face and throat  . A fast heartbeat  . A bad rash all over body  . Dizziness and weakness   Immunizations Administered    Name Date Dose VIS Date Route   Pfizer COVID-19 Vaccine 07/29/2019  1:33 PM 0.3 mL 05/03/2019 Intramuscular   Manufacturer: [place]   Lot: [id number]   [place]: [id number]

## 2019-09-06 DIAGNOSIS — R7301 Impaired fasting glucose: Secondary | ICD-10-CM | POA: Diagnosis not present

## 2019-09-06 DIAGNOSIS — Z79899 Other long term (current) drug therapy: Secondary | ICD-10-CM | POA: Diagnosis not present

## 2019-09-06 DIAGNOSIS — E7800 Pure hypercholesterolemia, unspecified: Secondary | ICD-10-CM | POA: Diagnosis not present

## 2019-09-06 DIAGNOSIS — M810 Age-related osteoporosis without current pathological fracture: Secondary | ICD-10-CM | POA: Diagnosis not present

## 2019-09-12 DIAGNOSIS — G4700 Insomnia, unspecified: Secondary | ICD-10-CM | POA: Diagnosis not present

## 2019-09-12 DIAGNOSIS — D692 Other nonthrombocytopenic purpura: Secondary | ICD-10-CM | POA: Diagnosis not present

## 2019-09-12 DIAGNOSIS — I771 Stricture of artery: Secondary | ICD-10-CM | POA: Diagnosis not present

## 2019-09-12 DIAGNOSIS — E7800 Pure hypercholesterolemia, unspecified: Secondary | ICD-10-CM | POA: Diagnosis not present

## 2019-09-12 DIAGNOSIS — M810 Age-related osteoporosis without current pathological fracture: Secondary | ICD-10-CM | POA: Diagnosis not present

## 2019-09-12 DIAGNOSIS — E559 Vitamin D deficiency, unspecified: Secondary | ICD-10-CM | POA: Diagnosis not present

## 2019-09-12 DIAGNOSIS — F330 Major depressive disorder, recurrent, mild: Secondary | ICD-10-CM | POA: Diagnosis not present

## 2019-09-12 DIAGNOSIS — Z0000 Encounter for general adult medical examination without abnormal findings: Secondary | ICD-10-CM | POA: Diagnosis not present

## 2019-09-12 DIAGNOSIS — Z86010 Personal history of colonic polyps: Secondary | ICD-10-CM | POA: Diagnosis not present

## 2019-09-12 DIAGNOSIS — M47814 Spondylosis without myelopathy or radiculopathy, thoracic region: Secondary | ICD-10-CM | POA: Diagnosis not present

## 2019-09-12 DIAGNOSIS — Z79899 Other long term (current) drug therapy: Secondary | ICD-10-CM | POA: Diagnosis not present

## 2019-09-12 DIAGNOSIS — R7301 Impaired fasting glucose: Secondary | ICD-10-CM | POA: Diagnosis not present

## 2019-10-02 ENCOUNTER — Other Ambulatory Visit: Payer: Self-pay | Admitting: Family Medicine

## 2019-10-02 DIAGNOSIS — E2839 Other primary ovarian failure: Secondary | ICD-10-CM

## 2019-12-06 DIAGNOSIS — D1801 Hemangioma of skin and subcutaneous tissue: Secondary | ICD-10-CM | POA: Diagnosis not present

## 2019-12-06 DIAGNOSIS — L814 Other melanin hyperpigmentation: Secondary | ICD-10-CM | POA: Diagnosis not present

## 2019-12-06 DIAGNOSIS — L821 Other seborrheic keratosis: Secondary | ICD-10-CM | POA: Diagnosis not present

## 2019-12-06 DIAGNOSIS — L820 Inflamed seborrheic keratosis: Secondary | ICD-10-CM | POA: Diagnosis not present

## 2019-12-06 DIAGNOSIS — D229 Melanocytic nevi, unspecified: Secondary | ICD-10-CM | POA: Diagnosis not present

## 2019-12-06 DIAGNOSIS — L900 Lichen sclerosus et atrophicus: Secondary | ICD-10-CM | POA: Diagnosis not present

## 2019-12-13 ENCOUNTER — Other Ambulatory Visit: Payer: Medicare Other

## 2020-02-24 ENCOUNTER — Ambulatory Visit
Admission: RE | Admit: 2020-02-24 | Discharge: 2020-02-24 | Disposition: A | Discharge status: Home / Self Care | Payer: Medicare Other | Source: Ambulatory Visit | Attending: Family Medicine | Admitting: Family Medicine

## 2020-02-24 ENCOUNTER — Other Ambulatory Visit: Payer: Self-pay

## 2020-02-24 DIAGNOSIS — M8589 Other specified disorders of bone density and structure, multiple sites: Secondary | ICD-10-CM | POA: Diagnosis not present

## 2020-02-24 DIAGNOSIS — E2839 Other primary ovarian failure: Secondary | ICD-10-CM

## 2020-02-24 DIAGNOSIS — Z780 Asymptomatic menopausal state: Secondary | ICD-10-CM | POA: Diagnosis not present

## 2020-02-28 DIAGNOSIS — Z23 Encounter for immunization: Secondary | ICD-10-CM | POA: Diagnosis not present

## 2020-05-12 ENCOUNTER — Other Ambulatory Visit: Payer: Self-pay | Admitting: Family Medicine

## 2020-05-12 DIAGNOSIS — Z1231 Encounter for screening mammogram for malignant neoplasm of breast: Secondary | ICD-10-CM

## 2020-05-27 DIAGNOSIS — G4700 Insomnia, unspecified: Secondary | ICD-10-CM | POA: Diagnosis not present

## 2020-05-27 DIAGNOSIS — Z20828 Contact with and (suspected) exposure to other viral communicable diseases: Secondary | ICD-10-CM | POA: Diagnosis not present

## 2020-05-27 DIAGNOSIS — F330 Major depressive disorder, recurrent, mild: Secondary | ICD-10-CM | POA: Diagnosis not present

## 2020-05-29 DIAGNOSIS — U071 COVID-19: Secondary | ICD-10-CM | POA: Diagnosis not present

## 2020-06-29 ENCOUNTER — Ambulatory Visit
Admission: RE | Admit: 2020-06-29 | Discharge: 2020-06-29 | Disposition: A | Discharge status: Home / Self Care | Payer: Medicare Other | Source: Ambulatory Visit | Attending: Family Medicine | Admitting: Family Medicine

## 2020-06-29 ENCOUNTER — Other Ambulatory Visit: Payer: Self-pay

## 2020-06-29 DIAGNOSIS — Z1231 Encounter for screening mammogram for malignant neoplasm of breast: Secondary | ICD-10-CM

## 2020-07-24 DIAGNOSIS — L900 Lichen sclerosus et atrophicus: Secondary | ICD-10-CM | POA: Diagnosis not present

## 2020-07-24 DIAGNOSIS — L820 Inflamed seborrheic keratosis: Secondary | ICD-10-CM | POA: Diagnosis not present

## 2020-07-24 DIAGNOSIS — D485 Neoplasm of uncertain behavior of skin: Secondary | ICD-10-CM | POA: Diagnosis not present

## 2020-08-31 DIAGNOSIS — J988 Other specified respiratory disorders: Secondary | ICD-10-CM | POA: Diagnosis not present

## 2020-08-31 DIAGNOSIS — R059 Cough, unspecified: Secondary | ICD-10-CM | POA: Diagnosis not present

## 2020-10-06 DIAGNOSIS — E7800 Pure hypercholesterolemia, unspecified: Secondary | ICD-10-CM | POA: Diagnosis not present

## 2020-10-06 DIAGNOSIS — E559 Vitamin D deficiency, unspecified: Secondary | ICD-10-CM | POA: Diagnosis not present

## 2020-10-06 DIAGNOSIS — Z79899 Other long term (current) drug therapy: Secondary | ICD-10-CM | POA: Diagnosis not present

## 2020-10-06 DIAGNOSIS — R7301 Impaired fasting glucose: Secondary | ICD-10-CM | POA: Diagnosis not present

## 2020-10-12 DIAGNOSIS — E559 Vitamin D deficiency, unspecified: Secondary | ICD-10-CM | POA: Diagnosis not present

## 2020-10-12 DIAGNOSIS — R011 Cardiac murmur, unspecified: Secondary | ICD-10-CM | POA: Diagnosis not present

## 2020-10-12 DIAGNOSIS — Z79899 Other long term (current) drug therapy: Secondary | ICD-10-CM | POA: Diagnosis not present

## 2020-10-12 DIAGNOSIS — E7800 Pure hypercholesterolemia, unspecified: Secondary | ICD-10-CM | POA: Diagnosis not present

## 2020-10-12 DIAGNOSIS — I10 Essential (primary) hypertension: Secondary | ICD-10-CM | POA: Diagnosis not present

## 2020-10-12 DIAGNOSIS — I771 Stricture of artery: Secondary | ICD-10-CM | POA: Diagnosis not present

## 2020-10-12 DIAGNOSIS — M810 Age-related osteoporosis without current pathological fracture: Secondary | ICD-10-CM | POA: Diagnosis not present

## 2020-10-12 DIAGNOSIS — F322 Major depressive disorder, single episode, severe without psychotic features: Secondary | ICD-10-CM | POA: Diagnosis not present

## 2020-10-12 DIAGNOSIS — R7303 Prediabetes: Secondary | ICD-10-CM | POA: Diagnosis not present

## 2020-10-12 DIAGNOSIS — R7301 Impaired fasting glucose: Secondary | ICD-10-CM | POA: Diagnosis not present

## 2020-10-12 DIAGNOSIS — Z23 Encounter for immunization: Secondary | ICD-10-CM | POA: Diagnosis not present

## 2020-10-12 DIAGNOSIS — Z0000 Encounter for general adult medical examination without abnormal findings: Secondary | ICD-10-CM | POA: Diagnosis not present

## 2020-10-15 ENCOUNTER — Other Ambulatory Visit (HOSPITAL_COMMUNITY): Payer: Self-pay | Admitting: Family Medicine

## 2020-10-15 DIAGNOSIS — R011 Cardiac murmur, unspecified: Secondary | ICD-10-CM

## 2020-10-23 DIAGNOSIS — L900 Lichen sclerosus et atrophicus: Secondary | ICD-10-CM | POA: Diagnosis not present

## 2020-11-12 ENCOUNTER — Other Ambulatory Visit: Payer: Self-pay

## 2020-11-12 ENCOUNTER — Ambulatory Visit (HOSPITAL_COMMUNITY): Payer: Medicare Other | Attending: Cardiology

## 2020-11-12 DIAGNOSIS — R011 Cardiac murmur, unspecified: Secondary | ICD-10-CM | POA: Diagnosis not present

## 2020-11-13 LAB — ECHOCARDIOGRAM COMPLETE
AR max vel: 3.19 cm2
AV Area VTI: 3 cm2
AV Area mean vel: 2.83 cm2
AV Mean grad: 4.3 mmHg
AV Peak grad: 7.5 mmHg
Ao pk vel: 1.37 m/s
Area-P 1/2: 2.76 cm2
P 1/2 time: 390 msec
S' Lateral: 3 cm

## 2021-01-08 DIAGNOSIS — G4700 Insomnia, unspecified: Secondary | ICD-10-CM | POA: Diagnosis not present

## 2021-01-08 DIAGNOSIS — M79605 Pain in left leg: Secondary | ICD-10-CM | POA: Diagnosis not present

## 2021-01-22 DIAGNOSIS — M25562 Pain in left knee: Secondary | ICD-10-CM | POA: Diagnosis not present

## 2021-01-26 ENCOUNTER — Other Ambulatory Visit: Payer: Self-pay | Admitting: Sports Medicine

## 2021-01-26 ENCOUNTER — Ambulatory Visit
Admission: RE | Admit: 2021-01-26 | Discharge: 2021-01-26 | Disposition: A | Discharge status: Home / Self Care | Payer: Medicare Other | Source: Ambulatory Visit | Attending: Sports Medicine | Admitting: Sports Medicine

## 2021-01-26 ENCOUNTER — Other Ambulatory Visit: Payer: Self-pay

## 2021-01-26 DIAGNOSIS — M25562 Pain in left knee: Secondary | ICD-10-CM

## 2021-01-26 DIAGNOSIS — M1712 Unilateral primary osteoarthritis, left knee: Secondary | ICD-10-CM | POA: Diagnosis not present

## 2021-01-27 DIAGNOSIS — M25562 Pain in left knee: Secondary | ICD-10-CM | POA: Diagnosis not present

## 2021-01-27 DIAGNOSIS — M62831 Muscle spasm of calf: Secondary | ICD-10-CM | POA: Diagnosis not present

## 2021-01-29 DIAGNOSIS — L900 Lichen sclerosus et atrophicus: Secondary | ICD-10-CM | POA: Diagnosis not present

## 2021-01-29 DIAGNOSIS — M25562 Pain in left knee: Secondary | ICD-10-CM | POA: Diagnosis not present

## 2021-01-29 DIAGNOSIS — M62831 Muscle spasm of calf: Secondary | ICD-10-CM | POA: Diagnosis not present

## 2021-02-23 DIAGNOSIS — M25562 Pain in left knee: Secondary | ICD-10-CM | POA: Diagnosis not present

## 2021-02-23 DIAGNOSIS — M62831 Muscle spasm of calf: Secondary | ICD-10-CM | POA: Diagnosis not present

## 2021-02-24 DIAGNOSIS — M25562 Pain in left knee: Secondary | ICD-10-CM | POA: Diagnosis not present

## 2021-02-25 ENCOUNTER — Other Ambulatory Visit: Payer: Self-pay | Admitting: Sports Medicine

## 2021-02-25 DIAGNOSIS — M25562 Pain in left knee: Secondary | ICD-10-CM | POA: Diagnosis not present

## 2021-02-25 DIAGNOSIS — M62831 Muscle spasm of calf: Secondary | ICD-10-CM | POA: Diagnosis not present

## 2021-03-02 DIAGNOSIS — M62831 Muscle spasm of calf: Secondary | ICD-10-CM | POA: Diagnosis not present

## 2021-03-02 DIAGNOSIS — M25562 Pain in left knee: Secondary | ICD-10-CM | POA: Diagnosis not present

## 2021-03-04 DIAGNOSIS — M62831 Muscle spasm of calf: Secondary | ICD-10-CM | POA: Diagnosis not present

## 2021-03-04 DIAGNOSIS — M25562 Pain in left knee: Secondary | ICD-10-CM | POA: Diagnosis not present

## 2021-03-09 DIAGNOSIS — M62831 Muscle spasm of calf: Secondary | ICD-10-CM | POA: Diagnosis not present

## 2021-03-09 DIAGNOSIS — M25562 Pain in left knee: Secondary | ICD-10-CM | POA: Diagnosis not present

## 2021-03-11 DIAGNOSIS — M25562 Pain in left knee: Secondary | ICD-10-CM | POA: Diagnosis not present

## 2021-03-11 DIAGNOSIS — M62831 Muscle spasm of calf: Secondary | ICD-10-CM | POA: Diagnosis not present

## 2021-03-12 ENCOUNTER — Ambulatory Visit
Admission: RE | Admit: 2021-03-12 | Discharge: 2021-03-12 | Disposition: A | Discharge status: Home / Self Care | Payer: Medicare Other | Source: Ambulatory Visit | Attending: Sports Medicine | Admitting: Sports Medicine

## 2021-03-12 ENCOUNTER — Other Ambulatory Visit: Payer: Self-pay

## 2021-03-12 DIAGNOSIS — M25462 Effusion, left knee: Secondary | ICD-10-CM | POA: Diagnosis not present

## 2021-03-12 DIAGNOSIS — M7122 Synovial cyst of popliteal space [Baker], left knee: Secondary | ICD-10-CM | POA: Diagnosis not present

## 2021-03-12 DIAGNOSIS — M25562 Pain in left knee: Secondary | ICD-10-CM

## 2021-03-16 DIAGNOSIS — M25562 Pain in left knee: Secondary | ICD-10-CM | POA: Diagnosis not present

## 2021-03-16 DIAGNOSIS — M62831 Muscle spasm of calf: Secondary | ICD-10-CM | POA: Diagnosis not present

## 2021-03-17 DIAGNOSIS — M1712 Unilateral primary osteoarthritis, left knee: Secondary | ICD-10-CM | POA: Diagnosis not present

## 2021-03-18 DIAGNOSIS — M62831 Muscle spasm of calf: Secondary | ICD-10-CM | POA: Diagnosis not present

## 2021-03-18 DIAGNOSIS — M25562 Pain in left knee: Secondary | ICD-10-CM | POA: Diagnosis not present

## 2021-03-23 DIAGNOSIS — M1712 Unilateral primary osteoarthritis, left knee: Secondary | ICD-10-CM | POA: Diagnosis not present

## 2021-03-30 DIAGNOSIS — Z01818 Encounter for other preprocedural examination: Secondary | ICD-10-CM | POA: Diagnosis not present

## 2021-03-30 DIAGNOSIS — M1990 Unspecified osteoarthritis, unspecified site: Secondary | ICD-10-CM | POA: Diagnosis not present

## 2021-03-30 DIAGNOSIS — G4700 Insomnia, unspecified: Secondary | ICD-10-CM | POA: Diagnosis not present

## 2021-03-30 DIAGNOSIS — Z23 Encounter for immunization: Secondary | ICD-10-CM | POA: Diagnosis not present

## 2021-04-26 DIAGNOSIS — Z01818 Encounter for other preprocedural examination: Secondary | ICD-10-CM | POA: Diagnosis not present

## 2021-05-10 DIAGNOSIS — G8918 Other acute postprocedural pain: Secondary | ICD-10-CM | POA: Diagnosis not present

## 2021-05-10 DIAGNOSIS — M1712 Unilateral primary osteoarthritis, left knee: Secondary | ICD-10-CM | POA: Diagnosis not present

## 2021-05-18 DIAGNOSIS — M25562 Pain in left knee: Secondary | ICD-10-CM | POA: Diagnosis not present

## 2021-05-18 DIAGNOSIS — M6281 Muscle weakness (generalized): Secondary | ICD-10-CM | POA: Diagnosis not present

## 2021-05-18 DIAGNOSIS — Z4789 Encounter for other orthopedic aftercare: Secondary | ICD-10-CM | POA: Diagnosis not present

## 2021-05-20 DIAGNOSIS — Z96652 Presence of left artificial knee joint: Secondary | ICD-10-CM | POA: Diagnosis not present

## 2021-05-20 DIAGNOSIS — M25462 Effusion, left knee: Secondary | ICD-10-CM | POA: Diagnosis not present

## 2021-05-21 DIAGNOSIS — M25562 Pain in left knee: Secondary | ICD-10-CM | POA: Diagnosis not present

## 2021-05-21 DIAGNOSIS — M6281 Muscle weakness (generalized): Secondary | ICD-10-CM | POA: Diagnosis not present

## 2021-05-21 DIAGNOSIS — Z4789 Encounter for other orthopedic aftercare: Secondary | ICD-10-CM | POA: Diagnosis not present

## 2021-05-26 DIAGNOSIS — M25562 Pain in left knee: Secondary | ICD-10-CM | POA: Diagnosis not present

## 2021-05-26 DIAGNOSIS — Z4789 Encounter for other orthopedic aftercare: Secondary | ICD-10-CM | POA: Diagnosis not present

## 2021-05-26 DIAGNOSIS — M6281 Muscle weakness (generalized): Secondary | ICD-10-CM | POA: Diagnosis not present

## 2021-05-28 DIAGNOSIS — M25562 Pain in left knee: Secondary | ICD-10-CM | POA: Diagnosis not present

## 2021-05-28 DIAGNOSIS — M6281 Muscle weakness (generalized): Secondary | ICD-10-CM | POA: Diagnosis not present

## 2021-05-28 DIAGNOSIS — Z4789 Encounter for other orthopedic aftercare: Secondary | ICD-10-CM | POA: Diagnosis not present

## 2021-06-01 DIAGNOSIS — M6281 Muscle weakness (generalized): Secondary | ICD-10-CM | POA: Diagnosis not present

## 2021-06-01 DIAGNOSIS — M25562 Pain in left knee: Secondary | ICD-10-CM | POA: Diagnosis not present

## 2021-06-01 DIAGNOSIS — Z4789 Encounter for other orthopedic aftercare: Secondary | ICD-10-CM | POA: Diagnosis not present

## 2021-06-03 DIAGNOSIS — Z4789 Encounter for other orthopedic aftercare: Secondary | ICD-10-CM | POA: Diagnosis not present

## 2021-06-03 DIAGNOSIS — M6281 Muscle weakness (generalized): Secondary | ICD-10-CM | POA: Diagnosis not present

## 2021-06-03 DIAGNOSIS — M25562 Pain in left knee: Secondary | ICD-10-CM | POA: Diagnosis not present

## 2021-06-08 DIAGNOSIS — M6281 Muscle weakness (generalized): Secondary | ICD-10-CM | POA: Diagnosis not present

## 2021-06-08 DIAGNOSIS — Z4789 Encounter for other orthopedic aftercare: Secondary | ICD-10-CM | POA: Diagnosis not present

## 2021-06-08 DIAGNOSIS — M25562 Pain in left knee: Secondary | ICD-10-CM | POA: Diagnosis not present

## 2021-06-10 DIAGNOSIS — M6281 Muscle weakness (generalized): Secondary | ICD-10-CM | POA: Diagnosis not present

## 2021-06-10 DIAGNOSIS — Z4789 Encounter for other orthopedic aftercare: Secondary | ICD-10-CM | POA: Diagnosis not present

## 2021-06-10 DIAGNOSIS — M25562 Pain in left knee: Secondary | ICD-10-CM | POA: Diagnosis not present

## 2021-06-15 DIAGNOSIS — M25562 Pain in left knee: Secondary | ICD-10-CM | POA: Diagnosis not present

## 2021-06-15 DIAGNOSIS — Z4789 Encounter for other orthopedic aftercare: Secondary | ICD-10-CM | POA: Diagnosis not present

## 2021-06-15 DIAGNOSIS — M6281 Muscle weakness (generalized): Secondary | ICD-10-CM | POA: Diagnosis not present

## 2021-06-17 DIAGNOSIS — M6281 Muscle weakness (generalized): Secondary | ICD-10-CM | POA: Diagnosis not present

## 2021-06-17 DIAGNOSIS — M25562 Pain in left knee: Secondary | ICD-10-CM | POA: Diagnosis not present

## 2021-06-17 DIAGNOSIS — Z4789 Encounter for other orthopedic aftercare: Secondary | ICD-10-CM | POA: Diagnosis not present

## 2021-06-22 DIAGNOSIS — Z4789 Encounter for other orthopedic aftercare: Secondary | ICD-10-CM | POA: Diagnosis not present

## 2021-06-22 DIAGNOSIS — M25562 Pain in left knee: Secondary | ICD-10-CM | POA: Diagnosis not present

## 2021-06-22 DIAGNOSIS — M6281 Muscle weakness (generalized): Secondary | ICD-10-CM | POA: Diagnosis not present

## 2021-06-24 DIAGNOSIS — Z4789 Encounter for other orthopedic aftercare: Secondary | ICD-10-CM | POA: Diagnosis not present

## 2021-06-24 DIAGNOSIS — M6281 Muscle weakness (generalized): Secondary | ICD-10-CM | POA: Diagnosis not present

## 2021-06-24 DIAGNOSIS — M25562 Pain in left knee: Secondary | ICD-10-CM | POA: Diagnosis not present

## 2021-06-29 DIAGNOSIS — M25562 Pain in left knee: Secondary | ICD-10-CM | POA: Diagnosis not present

## 2021-06-29 DIAGNOSIS — M6281 Muscle weakness (generalized): Secondary | ICD-10-CM | POA: Diagnosis not present

## 2021-06-29 DIAGNOSIS — Z4789 Encounter for other orthopedic aftercare: Secondary | ICD-10-CM | POA: Diagnosis not present

## 2021-07-01 DIAGNOSIS — M25562 Pain in left knee: Secondary | ICD-10-CM | POA: Diagnosis not present

## 2021-07-01 DIAGNOSIS — M6281 Muscle weakness (generalized): Secondary | ICD-10-CM | POA: Diagnosis not present

## 2021-07-01 DIAGNOSIS — Z4789 Encounter for other orthopedic aftercare: Secondary | ICD-10-CM | POA: Diagnosis not present

## 2021-07-06 DIAGNOSIS — M6281 Muscle weakness (generalized): Secondary | ICD-10-CM | POA: Diagnosis not present

## 2021-07-06 DIAGNOSIS — M25562 Pain in left knee: Secondary | ICD-10-CM | POA: Diagnosis not present

## 2021-07-06 DIAGNOSIS — Z4789 Encounter for other orthopedic aftercare: Secondary | ICD-10-CM | POA: Diagnosis not present

## 2021-07-13 DIAGNOSIS — Z4789 Encounter for other orthopedic aftercare: Secondary | ICD-10-CM | POA: Diagnosis not present

## 2021-07-13 DIAGNOSIS — M25562 Pain in left knee: Secondary | ICD-10-CM | POA: Diagnosis not present

## 2021-07-13 DIAGNOSIS — M6281 Muscle weakness (generalized): Secondary | ICD-10-CM | POA: Diagnosis not present

## 2021-07-15 DIAGNOSIS — M6281 Muscle weakness (generalized): Secondary | ICD-10-CM | POA: Diagnosis not present

## 2021-07-15 DIAGNOSIS — M25562 Pain in left knee: Secondary | ICD-10-CM | POA: Diagnosis not present

## 2021-07-15 DIAGNOSIS — Z4789 Encounter for other orthopedic aftercare: Secondary | ICD-10-CM | POA: Diagnosis not present

## 2021-07-20 DIAGNOSIS — M6281 Muscle weakness (generalized): Secondary | ICD-10-CM | POA: Diagnosis not present

## 2021-07-20 DIAGNOSIS — Z4789 Encounter for other orthopedic aftercare: Secondary | ICD-10-CM | POA: Diagnosis not present

## 2021-07-20 DIAGNOSIS — M25562 Pain in left knee: Secondary | ICD-10-CM | POA: Diagnosis not present

## 2021-07-22 DIAGNOSIS — M6281 Muscle weakness (generalized): Secondary | ICD-10-CM | POA: Diagnosis not present

## 2021-07-22 DIAGNOSIS — M25562 Pain in left knee: Secondary | ICD-10-CM | POA: Diagnosis not present

## 2021-07-22 DIAGNOSIS — Z4789 Encounter for other orthopedic aftercare: Secondary | ICD-10-CM | POA: Diagnosis not present

## 2021-07-27 DIAGNOSIS — M1711 Unilateral primary osteoarthritis, right knee: Secondary | ICD-10-CM | POA: Diagnosis not present

## 2021-07-27 DIAGNOSIS — M6281 Muscle weakness (generalized): Secondary | ICD-10-CM | POA: Diagnosis not present

## 2021-07-27 DIAGNOSIS — Z4789 Encounter for other orthopedic aftercare: Secondary | ICD-10-CM | POA: Diagnosis not present

## 2021-07-27 DIAGNOSIS — M25562 Pain in left knee: Secondary | ICD-10-CM | POA: Diagnosis not present

## 2021-07-29 DIAGNOSIS — Z4789 Encounter for other orthopedic aftercare: Secondary | ICD-10-CM | POA: Diagnosis not present

## 2021-07-29 DIAGNOSIS — M25562 Pain in left knee: Secondary | ICD-10-CM | POA: Diagnosis not present

## 2021-07-29 DIAGNOSIS — M6281 Muscle weakness (generalized): Secondary | ICD-10-CM | POA: Diagnosis not present

## 2021-07-30 DIAGNOSIS — L821 Other seborrheic keratosis: Secondary | ICD-10-CM | POA: Diagnosis not present

## 2021-07-30 DIAGNOSIS — L900 Lichen sclerosus et atrophicus: Secondary | ICD-10-CM | POA: Diagnosis not present

## 2021-07-30 DIAGNOSIS — U071 COVID-19: Secondary | ICD-10-CM | POA: Diagnosis not present

## 2021-07-30 DIAGNOSIS — Z79899 Other long term (current) drug therapy: Secondary | ICD-10-CM | POA: Diagnosis not present

## 2021-09-02 DIAGNOSIS — D485 Neoplasm of uncertain behavior of skin: Secondary | ICD-10-CM | POA: Diagnosis not present

## 2021-09-02 DIAGNOSIS — H02423 Myogenic ptosis of bilateral eyelids: Secondary | ICD-10-CM | POA: Diagnosis not present

## 2021-09-02 DIAGNOSIS — H02831 Dermatochalasis of right upper eyelid: Secondary | ICD-10-CM | POA: Diagnosis not present

## 2021-09-02 DIAGNOSIS — H02834 Dermatochalasis of left upper eyelid: Secondary | ICD-10-CM | POA: Diagnosis not present

## 2021-09-02 DIAGNOSIS — H53483 Generalized contraction of visual field, bilateral: Secondary | ICD-10-CM | POA: Diagnosis not present

## 2021-09-02 DIAGNOSIS — H02413 Mechanical ptosis of bilateral eyelids: Secondary | ICD-10-CM | POA: Diagnosis not present

## 2021-09-02 DIAGNOSIS — H57813 Brow ptosis, bilateral: Secondary | ICD-10-CM | POA: Diagnosis not present

## 2021-09-02 DIAGNOSIS — H0279 Other degenerative disorders of eyelid and periocular area: Secondary | ICD-10-CM | POA: Diagnosis not present

## 2021-09-06 ENCOUNTER — Other Ambulatory Visit: Payer: Self-pay | Admitting: Family Medicine

## 2021-09-06 DIAGNOSIS — Z1231 Encounter for screening mammogram for malignant neoplasm of breast: Secondary | ICD-10-CM

## 2021-09-07 ENCOUNTER — Ambulatory Visit
Admission: RE | Admit: 2021-09-07 | Discharge: 2021-09-07 | Disposition: A | Discharge status: Home / Self Care | Payer: Medicare Other | Source: Ambulatory Visit | Attending: Family Medicine | Admitting: Family Medicine

## 2021-09-07 DIAGNOSIS — Z1231 Encounter for screening mammogram for malignant neoplasm of breast: Secondary | ICD-10-CM

## 2021-09-24 DIAGNOSIS — C441192 Basal cell carcinoma of skin of left lower eyelid, including canthus: Secondary | ICD-10-CM | POA: Diagnosis not present

## 2021-09-24 DIAGNOSIS — D485 Neoplasm of uncertain behavior of skin: Secondary | ICD-10-CM | POA: Diagnosis not present

## 2021-11-02 DIAGNOSIS — Z96652 Presence of left artificial knee joint: Secondary | ICD-10-CM | POA: Diagnosis not present

## 2021-11-02 DIAGNOSIS — Z471 Aftercare following joint replacement surgery: Secondary | ICD-10-CM | POA: Diagnosis not present

## 2021-11-17 DIAGNOSIS — D1801 Hemangioma of skin and subcutaneous tissue: Secondary | ICD-10-CM | POA: Diagnosis not present

## 2021-11-17 DIAGNOSIS — Z85828 Personal history of other malignant neoplasm of skin: Secondary | ICD-10-CM | POA: Diagnosis not present

## 2021-11-17 DIAGNOSIS — L280 Lichen simplex chronicus: Secondary | ICD-10-CM | POA: Diagnosis not present

## 2021-11-17 DIAGNOSIS — L814 Other melanin hyperpigmentation: Secondary | ICD-10-CM | POA: Diagnosis not present

## 2021-11-17 DIAGNOSIS — C44529 Squamous cell carcinoma of skin of other part of trunk: Secondary | ICD-10-CM | POA: Diagnosis not present

## 2021-11-17 DIAGNOSIS — L821 Other seborrheic keratosis: Secondary | ICD-10-CM | POA: Diagnosis not present

## 2021-12-13 DIAGNOSIS — R7303 Prediabetes: Secondary | ICD-10-CM | POA: Diagnosis not present

## 2021-12-13 DIAGNOSIS — Z79899 Other long term (current) drug therapy: Secondary | ICD-10-CM | POA: Diagnosis not present

## 2021-12-13 DIAGNOSIS — E559 Vitamin D deficiency, unspecified: Secondary | ICD-10-CM | POA: Diagnosis not present

## 2021-12-13 DIAGNOSIS — E7800 Pure hypercholesterolemia, unspecified: Secondary | ICD-10-CM | POA: Diagnosis not present

## 2021-12-15 DIAGNOSIS — I771 Stricture of artery: Secondary | ICD-10-CM | POA: Diagnosis not present

## 2021-12-15 DIAGNOSIS — G4700 Insomnia, unspecified: Secondary | ICD-10-CM | POA: Diagnosis not present

## 2021-12-15 DIAGNOSIS — E7800 Pure hypercholesterolemia, unspecified: Secondary | ICD-10-CM | POA: Diagnosis not present

## 2021-12-15 DIAGNOSIS — E559 Vitamin D deficiency, unspecified: Secondary | ICD-10-CM | POA: Diagnosis not present

## 2021-12-15 DIAGNOSIS — I10 Essential (primary) hypertension: Secondary | ICD-10-CM | POA: Diagnosis not present

## 2021-12-15 DIAGNOSIS — R7303 Prediabetes: Secondary | ICD-10-CM | POA: Diagnosis not present

## 2021-12-15 DIAGNOSIS — M810 Age-related osteoporosis without current pathological fracture: Secondary | ICD-10-CM | POA: Diagnosis not present

## 2021-12-15 DIAGNOSIS — Z23 Encounter for immunization: Secondary | ICD-10-CM | POA: Diagnosis not present

## 2021-12-15 DIAGNOSIS — Z79899 Other long term (current) drug therapy: Secondary | ICD-10-CM | POA: Diagnosis not present

## 2021-12-15 DIAGNOSIS — M47814 Spondylosis without myelopathy or radiculopathy, thoracic region: Secondary | ICD-10-CM | POA: Diagnosis not present

## 2021-12-15 DIAGNOSIS — Z800 Family history of malignant neoplasm of digestive organs: Secondary | ICD-10-CM | POA: Diagnosis not present

## 2021-12-15 DIAGNOSIS — Z0000 Encounter for general adult medical examination without abnormal findings: Secondary | ICD-10-CM | POA: Diagnosis not present

## 2022-02-04 DIAGNOSIS — D2261 Melanocytic nevi of right upper limb, including shoulder: Secondary | ICD-10-CM | POA: Diagnosis not present

## 2022-02-04 DIAGNOSIS — L900 Lichen sclerosus et atrophicus: Secondary | ICD-10-CM | POA: Diagnosis not present

## 2022-02-04 DIAGNOSIS — I781 Nevus, non-neoplastic: Secondary | ICD-10-CM | POA: Diagnosis not present

## 2022-02-04 DIAGNOSIS — Z85828 Personal history of other malignant neoplasm of skin: Secondary | ICD-10-CM | POA: Diagnosis not present

## 2022-02-04 DIAGNOSIS — D2272 Melanocytic nevi of left lower limb, including hip: Secondary | ICD-10-CM | POA: Diagnosis not present

## 2022-02-04 DIAGNOSIS — L821 Other seborrheic keratosis: Secondary | ICD-10-CM | POA: Diagnosis not present

## 2022-02-04 DIAGNOSIS — D225 Melanocytic nevi of trunk: Secondary | ICD-10-CM | POA: Diagnosis not present

## 2022-02-04 DIAGNOSIS — L814 Other melanin hyperpigmentation: Secondary | ICD-10-CM | POA: Diagnosis not present

## 2022-02-04 DIAGNOSIS — D2262 Melanocytic nevi of left upper limb, including shoulder: Secondary | ICD-10-CM | POA: Diagnosis not present

## 2022-02-04 DIAGNOSIS — L280 Lichen simplex chronicus: Secondary | ICD-10-CM | POA: Diagnosis not present

## 2022-02-04 DIAGNOSIS — D2271 Melanocytic nevi of right lower limb, including hip: Secondary | ICD-10-CM | POA: Diagnosis not present

## 2022-02-10 DIAGNOSIS — Z85828 Personal history of other malignant neoplasm of skin: Secondary | ICD-10-CM | POA: Diagnosis not present

## 2022-02-10 DIAGNOSIS — C44529 Squamous cell carcinoma of skin of other part of trunk: Secondary | ICD-10-CM | POA: Diagnosis not present

## 2022-02-10 DIAGNOSIS — L905 Scar conditions and fibrosis of skin: Secondary | ICD-10-CM | POA: Diagnosis not present

## 2022-02-10 DIAGNOSIS — D225 Melanocytic nevi of trunk: Secondary | ICD-10-CM | POA: Diagnosis not present

## 2022-03-07 DIAGNOSIS — C441192 Basal cell carcinoma of skin of left lower eyelid, including canthus: Secondary | ICD-10-CM | POA: Diagnosis not present

## 2022-04-21 DIAGNOSIS — H02403 Unspecified ptosis of bilateral eyelids: Secondary | ICD-10-CM | POA: Diagnosis not present

## 2022-05-18 DIAGNOSIS — Z96652 Presence of left artificial knee joint: Secondary | ICD-10-CM | POA: Diagnosis not present

## 2022-05-25 DIAGNOSIS — Z1283 Encounter for screening for malignant neoplasm of skin: Secondary | ICD-10-CM | POA: Diagnosis not present

## 2022-05-25 DIAGNOSIS — L821 Other seborrheic keratosis: Secondary | ICD-10-CM | POA: Diagnosis not present

## 2022-05-25 DIAGNOSIS — D2262 Melanocytic nevi of left upper limb, including shoulder: Secondary | ICD-10-CM | POA: Diagnosis not present

## 2022-05-25 DIAGNOSIS — D2261 Melanocytic nevi of right upper limb, including shoulder: Secondary | ICD-10-CM | POA: Diagnosis not present

## 2022-05-25 DIAGNOSIS — D2272 Melanocytic nevi of left lower limb, including hip: Secondary | ICD-10-CM | POA: Diagnosis not present

## 2022-05-25 DIAGNOSIS — D224 Melanocytic nevi of scalp and neck: Secondary | ICD-10-CM | POA: Diagnosis not present

## 2022-05-25 DIAGNOSIS — L814 Other melanin hyperpigmentation: Secondary | ICD-10-CM | POA: Diagnosis not present

## 2022-05-25 DIAGNOSIS — Z85828 Personal history of other malignant neoplasm of skin: Secondary | ICD-10-CM | POA: Diagnosis not present

## 2022-05-25 DIAGNOSIS — D2271 Melanocytic nevi of right lower limb, including hip: Secondary | ICD-10-CM | POA: Diagnosis not present

## 2022-05-25 DIAGNOSIS — D225 Melanocytic nevi of trunk: Secondary | ICD-10-CM | POA: Diagnosis not present

## 2022-06-07 DIAGNOSIS — H0289 Other specified disorders of eyelid: Secondary | ICD-10-CM | POA: Diagnosis not present

## 2022-06-07 DIAGNOSIS — M952 Other acquired deformity of head: Secondary | ICD-10-CM | POA: Diagnosis not present

## 2022-06-07 DIAGNOSIS — Z85828 Personal history of other malignant neoplasm of skin: Secondary | ICD-10-CM | POA: Diagnosis not present

## 2022-06-07 DIAGNOSIS — C441192 Basal cell carcinoma of skin of left lower eyelid, including canthus: Secondary | ICD-10-CM | POA: Diagnosis not present

## 2022-06-14 DIAGNOSIS — G4700 Insomnia, unspecified: Secondary | ICD-10-CM | POA: Diagnosis not present

## 2022-08-05 DIAGNOSIS — L900 Lichen sclerosus et atrophicus: Secondary | ICD-10-CM | POA: Diagnosis not present

## 2022-08-10 ENCOUNTER — Other Ambulatory Visit: Payer: Self-pay | Admitting: Family Medicine

## 2022-08-10 DIAGNOSIS — Z1231 Encounter for screening mammogram for malignant neoplasm of breast: Secondary | ICD-10-CM

## 2022-09-28 ENCOUNTER — Ambulatory Visit: Payer: Medicare Other

## 2022-10-03 ENCOUNTER — Ambulatory Visit
Admission: RE | Admit: 2022-10-03 | Discharge: 2022-10-03 | Disposition: A | Discharge status: Home / Self Care | Payer: Medicare Other | Source: Ambulatory Visit | Attending: Family Medicine | Admitting: Family Medicine

## 2022-10-03 DIAGNOSIS — Z1231 Encounter for screening mammogram for malignant neoplasm of breast: Secondary | ICD-10-CM | POA: Diagnosis not present

## 2022-10-24 DIAGNOSIS — H029 Unspecified disorder of eyelid: Secondary | ICD-10-CM | POA: Diagnosis not present

## 2022-11-28 DIAGNOSIS — Z79899 Other long term (current) drug therapy: Secondary | ICD-10-CM | POA: Diagnosis not present

## 2022-11-28 DIAGNOSIS — E7800 Pure hypercholesterolemia, unspecified: Secondary | ICD-10-CM | POA: Diagnosis not present

## 2022-11-28 DIAGNOSIS — E559 Vitamin D deficiency, unspecified: Secondary | ICD-10-CM | POA: Diagnosis not present

## 2022-11-28 DIAGNOSIS — R7303 Prediabetes: Secondary | ICD-10-CM | POA: Diagnosis not present

## 2022-11-30 DIAGNOSIS — R7303 Prediabetes: Secondary | ICD-10-CM | POA: Diagnosis not present

## 2022-11-30 DIAGNOSIS — E7800 Pure hypercholesterolemia, unspecified: Secondary | ICD-10-CM | POA: Diagnosis not present

## 2022-11-30 DIAGNOSIS — Z1211 Encounter for screening for malignant neoplasm of colon: Secondary | ICD-10-CM | POA: Diagnosis not present

## 2022-11-30 DIAGNOSIS — E559 Vitamin D deficiency, unspecified: Secondary | ICD-10-CM | POA: Diagnosis not present

## 2022-11-30 DIAGNOSIS — Z79899 Other long term (current) drug therapy: Secondary | ICD-10-CM | POA: Diagnosis not present

## 2022-11-30 DIAGNOSIS — G4700 Insomnia, unspecified: Secondary | ICD-10-CM | POA: Diagnosis not present

## 2022-11-30 DIAGNOSIS — Z0000 Encounter for general adult medical examination without abnormal findings: Secondary | ICD-10-CM | POA: Diagnosis not present

## 2022-12-20 DIAGNOSIS — H53453 Other localized visual field defect, bilateral: Secondary | ICD-10-CM | POA: Diagnosis not present

## 2022-12-23 IMAGING — CR DG KNEE 1-2V*L*
2 series · 2 of 2 positions shown · non-contrast
Comparison: None.

CLINICAL DATA: Pain for 2 weeks without trauma

EXAM:
LEFT KNEE - 1-2 VIEW

[t knee ap left]
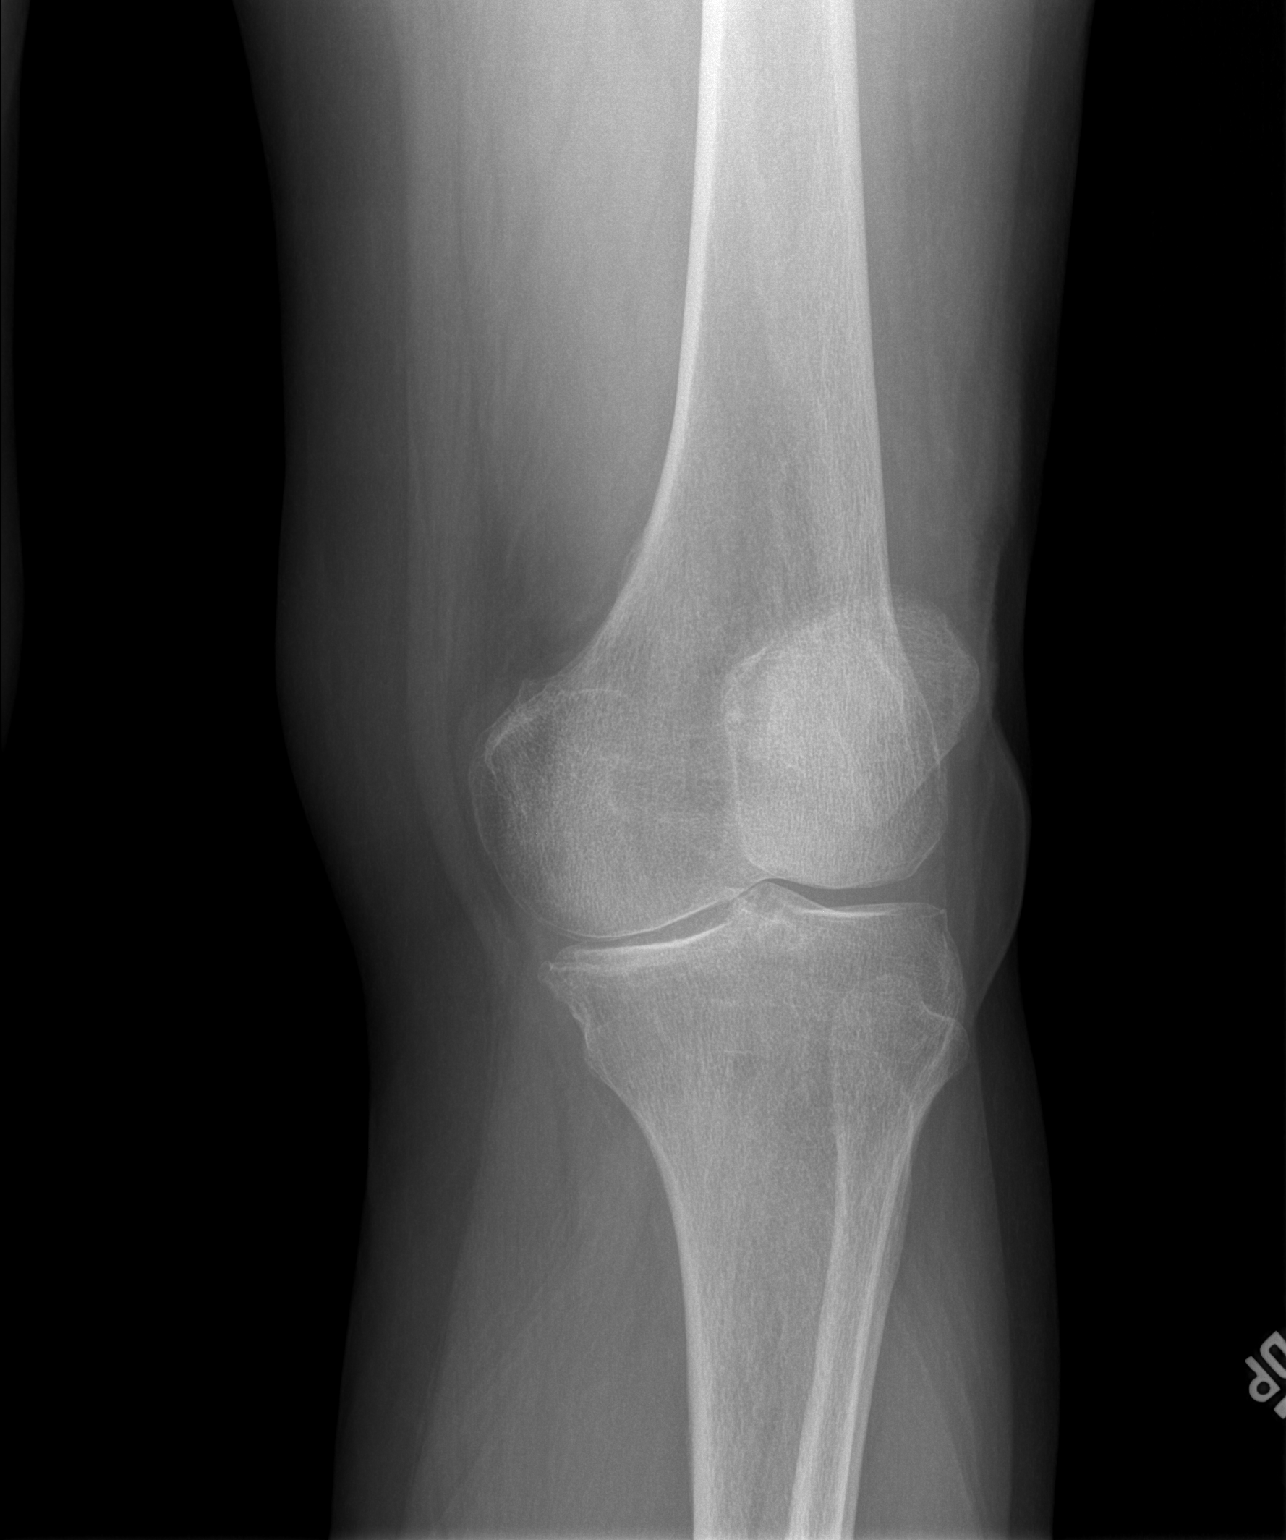

[t knee lat left]
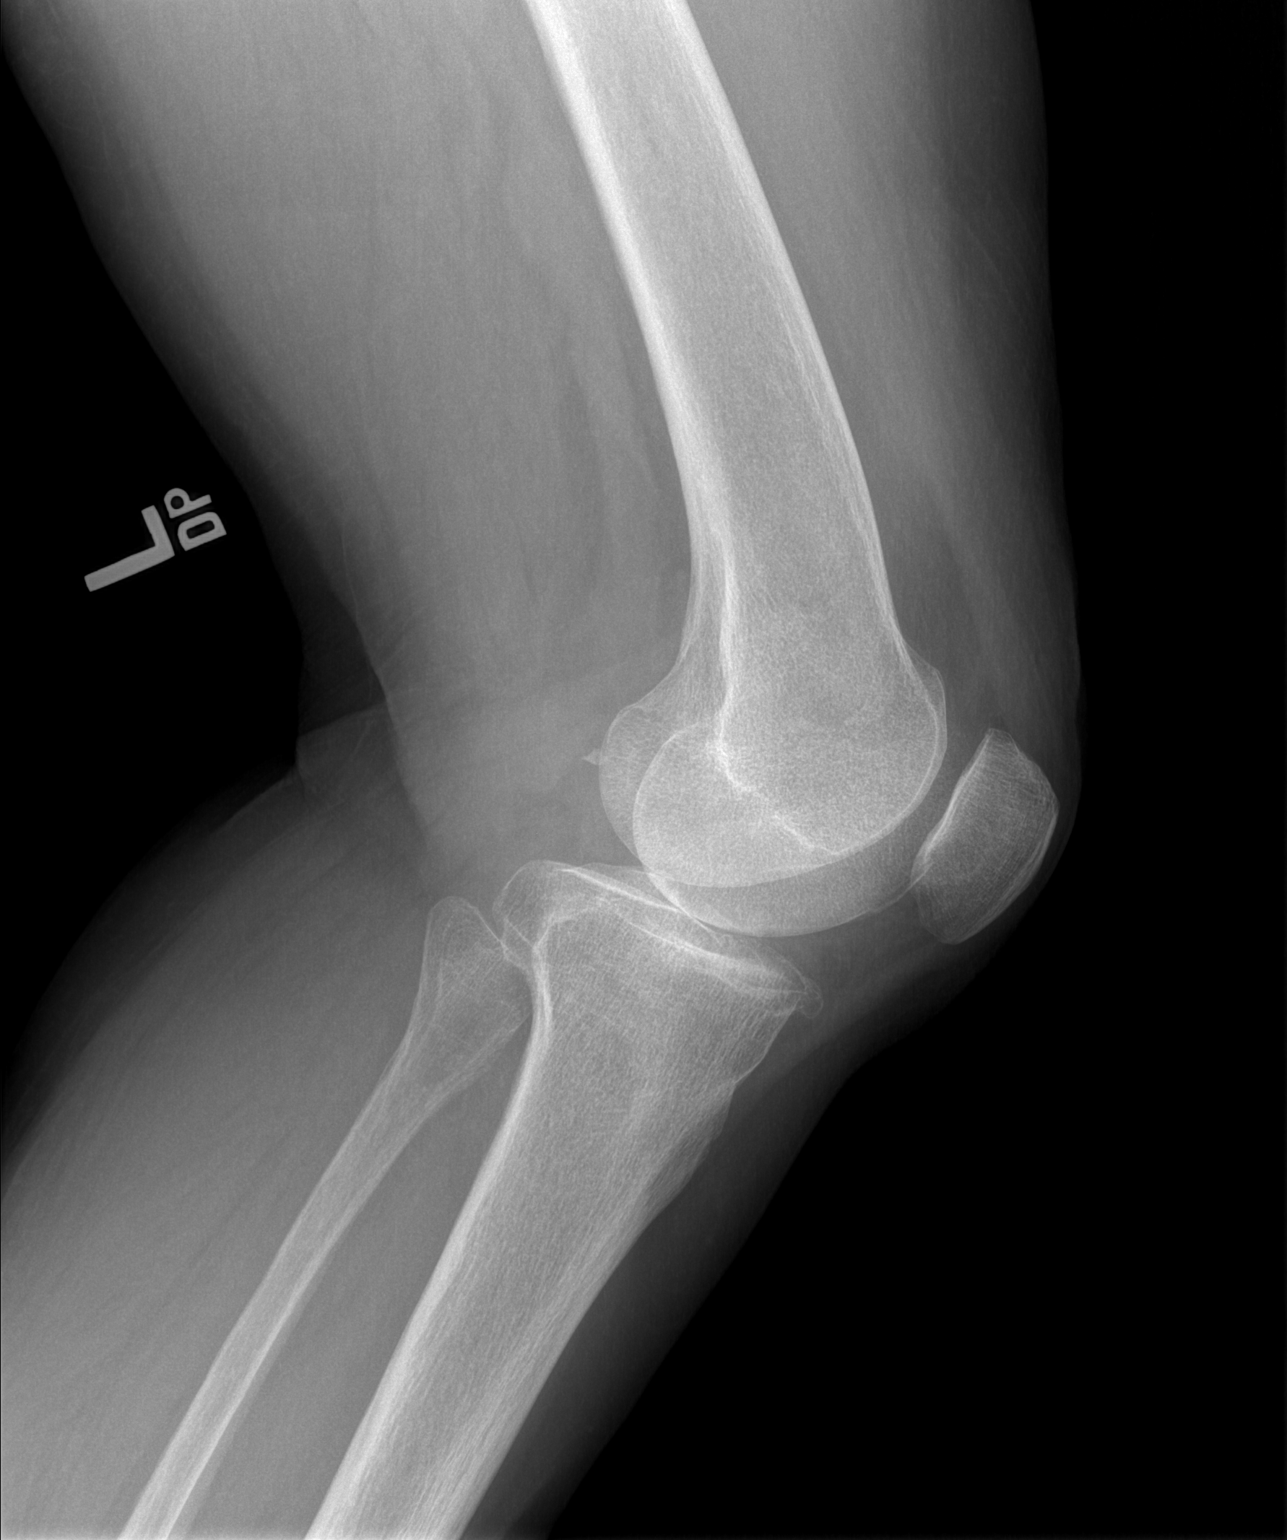

[2 of 2 positions shown; findings below may reference images not displayed]

FINDINGS: AP and lateral views. Mild to moderate medial compartment joint
space narrowing and subchondral sclerosis. No acute fracture or
dislocation. No joint effusion.
IMPRESSION: Mild to moderate medial compartment osteoarthritis.

## 2022-12-26 DIAGNOSIS — H02834 Dermatochalasis of left upper eyelid: Secondary | ICD-10-CM | POA: Diagnosis not present

## 2022-12-26 DIAGNOSIS — H57819 Brow ptosis, unspecified: Secondary | ICD-10-CM | POA: Diagnosis not present

## 2022-12-26 DIAGNOSIS — H02403 Unspecified ptosis of bilateral eyelids: Secondary | ICD-10-CM | POA: Diagnosis not present

## 2022-12-26 DIAGNOSIS — H02831 Dermatochalasis of right upper eyelid: Secondary | ICD-10-CM | POA: Diagnosis not present

## 2023-01-31 DIAGNOSIS — H25813 Combined forms of age-related cataract, bilateral: Secondary | ICD-10-CM | POA: Diagnosis not present

## 2023-02-16 DIAGNOSIS — H25811 Combined forms of age-related cataract, right eye: Secondary | ICD-10-CM | POA: Diagnosis not present

## 2023-02-16 DIAGNOSIS — H2511 Age-related nuclear cataract, right eye: Secondary | ICD-10-CM | POA: Diagnosis not present

## 2023-02-21 DIAGNOSIS — D1801 Hemangioma of skin and subcutaneous tissue: Secondary | ICD-10-CM | POA: Diagnosis not present

## 2023-02-21 DIAGNOSIS — Z85828 Personal history of other malignant neoplasm of skin: Secondary | ICD-10-CM | POA: Diagnosis not present

## 2023-02-21 DIAGNOSIS — L900 Lichen sclerosus et atrophicus: Secondary | ICD-10-CM | POA: Diagnosis not present

## 2023-02-21 DIAGNOSIS — L821 Other seborrheic keratosis: Secondary | ICD-10-CM | POA: Diagnosis not present

## 2023-03-02 DIAGNOSIS — H25812 Combined forms of age-related cataract, left eye: Secondary | ICD-10-CM | POA: Diagnosis not present

## 2023-03-08 DIAGNOSIS — D122 Benign neoplasm of ascending colon: Secondary | ICD-10-CM | POA: Diagnosis not present

## 2023-03-08 DIAGNOSIS — K5730 Diverticulosis of large intestine without perforation or abscess without bleeding: Secondary | ICD-10-CM | POA: Diagnosis not present

## 2023-03-08 DIAGNOSIS — K648 Other hemorrhoids: Secondary | ICD-10-CM | POA: Diagnosis not present

## 2023-03-08 DIAGNOSIS — Z800 Family history of malignant neoplasm of digestive organs: Secondary | ICD-10-CM | POA: Diagnosis not present

## 2023-03-08 DIAGNOSIS — Z860101 Personal history of adenomatous and serrated colon polyps: Secondary | ICD-10-CM | POA: Diagnosis not present

## 2023-03-08 DIAGNOSIS — Z09 Encounter for follow-up examination after completed treatment for conditions other than malignant neoplasm: Secondary | ICD-10-CM | POA: Diagnosis not present

## 2023-03-08 DIAGNOSIS — K635 Polyp of colon: Secondary | ICD-10-CM | POA: Diagnosis not present

## 2023-03-09 ENCOUNTER — Other Ambulatory Visit (HOSPITAL_COMMUNITY): Payer: Self-pay | Admitting: Family Medicine

## 2023-03-09 DIAGNOSIS — R011 Cardiac murmur, unspecified: Secondary | ICD-10-CM

## 2023-03-21 DIAGNOSIS — H57813 Brow ptosis, bilateral: Secondary | ICD-10-CM | POA: Diagnosis not present

## 2023-03-21 DIAGNOSIS — H02834 Dermatochalasis of left upper eyelid: Secondary | ICD-10-CM | POA: Diagnosis not present

## 2023-03-21 DIAGNOSIS — H02831 Dermatochalasis of right upper eyelid: Secondary | ICD-10-CM | POA: Diagnosis not present

## 2023-03-21 DIAGNOSIS — H02403 Unspecified ptosis of bilateral eyelids: Secondary | ICD-10-CM | POA: Diagnosis not present

## 2023-03-27 DIAGNOSIS — H57819 Brow ptosis, unspecified: Secondary | ICD-10-CM | POA: Diagnosis not present

## 2023-03-27 DIAGNOSIS — H02403 Unspecified ptosis of bilateral eyelids: Secondary | ICD-10-CM | POA: Diagnosis not present

## 2023-04-10 ENCOUNTER — Ambulatory Visit (HOSPITAL_COMMUNITY): Payer: Medicare Other | Attending: Family Medicine

## 2023-04-10 DIAGNOSIS — R011 Cardiac murmur, unspecified: Secondary | ICD-10-CM | POA: Diagnosis not present

## 2023-04-10 LAB — ECHOCARDIOGRAM COMPLETE
AR max vel: 2.11 cm2
AV Area VTI: 2 cm2
AV Area mean vel: 1.94 cm2
AV Mean grad: 6 mm[Hg]
AV Peak grad: 10.3 mm[Hg]
Ao pk vel: 1.61 m/s
Area-P 1/2: 3.32 cm2
P 1/2 time: 584 ms
S' Lateral: 2.9 cm

## 2023-06-09 DIAGNOSIS — G4700 Insomnia, unspecified: Secondary | ICD-10-CM | POA: Diagnosis not present

## 2023-07-10 DIAGNOSIS — H02831 Dermatochalasis of right upper eyelid: Secondary | ICD-10-CM | POA: Diagnosis not present

## 2023-07-10 DIAGNOSIS — H02403 Unspecified ptosis of bilateral eyelids: Secondary | ICD-10-CM | POA: Diagnosis not present

## 2023-07-10 DIAGNOSIS — H57813 Brow ptosis, bilateral: Secondary | ICD-10-CM | POA: Diagnosis not present

## 2023-07-10 DIAGNOSIS — H02834 Dermatochalasis of left upper eyelid: Secondary | ICD-10-CM | POA: Diagnosis not present

## 2023-11-21 DIAGNOSIS — L900 Lichen sclerosus et atrophicus: Secondary | ICD-10-CM | POA: Diagnosis not present

## 2023-12-04 DIAGNOSIS — E7800 Pure hypercholesterolemia, unspecified: Secondary | ICD-10-CM | POA: Diagnosis not present

## 2023-12-04 DIAGNOSIS — Z79899 Other long term (current) drug therapy: Secondary | ICD-10-CM | POA: Diagnosis not present

## 2023-12-04 DIAGNOSIS — R7303 Prediabetes: Secondary | ICD-10-CM | POA: Diagnosis not present

## 2023-12-04 DIAGNOSIS — E559 Vitamin D deficiency, unspecified: Secondary | ICD-10-CM | POA: Diagnosis not present

## 2023-12-06 DIAGNOSIS — G4700 Insomnia, unspecified: Secondary | ICD-10-CM | POA: Diagnosis not present

## 2023-12-06 DIAGNOSIS — Z0000 Encounter for general adult medical examination without abnormal findings: Secondary | ICD-10-CM | POA: Diagnosis not present

## 2023-12-07 ENCOUNTER — Other Ambulatory Visit (HOSPITAL_BASED_OUTPATIENT_CLINIC_OR_DEPARTMENT_OTHER): Payer: Self-pay | Admitting: Family Medicine

## 2023-12-07 ENCOUNTER — Other Ambulatory Visit: Payer: Self-pay | Admitting: Family Medicine

## 2023-12-07 DIAGNOSIS — Z780 Asymptomatic menopausal state: Secondary | ICD-10-CM

## 2023-12-07 DIAGNOSIS — Z1231 Encounter for screening mammogram for malignant neoplasm of breast: Secondary | ICD-10-CM

## 2023-12-22 ENCOUNTER — Ambulatory Visit
Admission: RE | Admit: 2023-12-22 | Discharge: 2023-12-22 | Disposition: A | Discharge status: Home / Self Care | Source: Ambulatory Visit | Attending: Family Medicine | Admitting: Family Medicine

## 2023-12-22 DIAGNOSIS — Z1231 Encounter for screening mammogram for malignant neoplasm of breast: Secondary | ICD-10-CM

## 2024-01-11 DIAGNOSIS — L309 Dermatitis, unspecified: Secondary | ICD-10-CM | POA: Diagnosis not present

## 2024-01-11 DIAGNOSIS — Z7184 Encounter for health counseling related to travel: Secondary | ICD-10-CM | POA: Diagnosis not present

## 2024-01-23 DIAGNOSIS — H9193 Unspecified hearing loss, bilateral: Secondary | ICD-10-CM | POA: Diagnosis not present

## 2024-01-23 DIAGNOSIS — H6123 Impacted cerumen, bilateral: Secondary | ICD-10-CM | POA: Diagnosis not present

## 2024-01-29 DIAGNOSIS — H9193 Unspecified hearing loss, bilateral: Secondary | ICD-10-CM | POA: Diagnosis not present

## 2024-02-12 DIAGNOSIS — Z23 Encounter for immunization: Secondary | ICD-10-CM | POA: Diagnosis not present

## 2024-04-04 DIAGNOSIS — Z974 Presence of external hearing-aid: Secondary | ICD-10-CM | POA: Diagnosis not present

## 2024-04-04 DIAGNOSIS — H903 Sensorineural hearing loss, bilateral: Secondary | ICD-10-CM | POA: Diagnosis not present

## 2024-04-04 DIAGNOSIS — R0989 Other specified symptoms and signs involving the circulatory and respiratory systems: Secondary | ICD-10-CM | POA: Diagnosis not present
